# Patient Record
Sex: Male | Born: 1965 | Race: Black or African American | Hispanic: No | Marital: Married | Smoking: Current every day smoker
Health system: Southern US, Community
[De-identification: ages and names within clinical notes are randomized; demographics above are authoritative.]

## PROBLEM LIST (undated history)

## (undated) DIAGNOSIS — K219 Gastro-esophageal reflux disease without esophagitis: Secondary | ICD-10-CM

## (undated) DIAGNOSIS — I10 Essential (primary) hypertension: Secondary | ICD-10-CM

---

## 2004-05-08 ENCOUNTER — Emergency Department: Payer: Self-pay | Admitting: Emergency Medicine

## 2005-05-17 ENCOUNTER — Emergency Department: Payer: Self-pay | Admitting: Unknown Physician Specialty

## 2007-11-08 ENCOUNTER — Ambulatory Visit: Payer: Self-pay | Admitting: Urology

## 2008-04-13 ENCOUNTER — Emergency Department: Payer: Self-pay | Admitting: Internal Medicine

## 2010-01-12 ENCOUNTER — Ambulatory Visit: Payer: Self-pay | Admitting: Internal Medicine

## 2010-01-12 ENCOUNTER — Inpatient Hospital Stay: Payer: Self-pay | Admitting: Internal Medicine

## 2011-03-20 ENCOUNTER — Ambulatory Visit: Payer: Self-pay | Admitting: Urology

## 2011-07-24 ENCOUNTER — Emergency Department: Payer: Self-pay | Admitting: *Deleted

## 2011-07-24 LAB — CBC
HCT: 42.9 % (ref 40.0–52.0)
HGB: 14.5 g/dL (ref 13.0–18.0)
MCH: 32.4 pg (ref 26.0–34.0)
MCHC: 33.8 g/dL (ref 32.0–36.0)
MCV: 96 fL (ref 80–100)
Platelet: 236 10*3/uL (ref 150–440)
RBC: 4.48 10*6/uL (ref 4.40–5.90)
RDW: 13 % (ref 11.5–14.5)
WBC: 6.8 10*3/uL (ref 3.8–10.6)

## 2011-07-24 LAB — CK TOTAL AND CKMB (NOT AT ARMC)
CK, Total: 812 U/L — ABNORMAL HIGH (ref 35–232)
CK, Total: 739 U/L — ABNORMAL HIGH (ref 35–232)
CK-MB: 2.4 ng/mL (ref 0.5–3.6)
CK-MB: 2.1 ng/mL (ref 0.5–3.6)

## 2011-07-24 LAB — BASIC METABOLIC PANEL
Anion Gap: 8 (ref 7–16)
BUN: 14 mg/dL (ref 7–18)
Calcium, Total: 8.4 mg/dL — ABNORMAL LOW (ref 8.5–10.1)
Chloride: 104 mmol/L (ref 98–107)
Co2: 25 mmol/L (ref 21–32)
Creatinine: 1.09 mg/dL (ref 0.60–1.30)
EGFR (African American): >60
EGFR (Non-African Amer.): >60
Glucose: 103 mg/dL — ABNORMAL HIGH (ref 65–99)
Osmolality: 275 (ref 275–301)
Potassium: 3.6 mmol/L (ref 3.5–5.1)
Sodium: 137 mmol/L (ref 136–145)

## 2011-07-24 LAB — TROPONIN I
Troponin-I: 0.02 ng/mL
Troponin-I: <0.02 ng/mL

## 2011-07-27 ENCOUNTER — Observation Stay: Payer: Self-pay | Admitting: Internal Medicine

## 2011-07-27 LAB — CBC
HCT: 39.5 % — ABNORMAL LOW (ref 40.0–52.0)
HGB: 13.3 g/dL (ref 13.0–18.0)
MCH: 32.7 pg (ref 26.0–34.0)
MCHC: 33.7 g/dL (ref 32.0–36.0)
MCV: 97 fL (ref 80–100)
Platelet: 208 10*3/uL (ref 150–440)
RBC: 4.07 10*6/uL — ABNORMAL LOW (ref 4.40–5.90)
RDW: 12.7 % (ref 11.5–14.5)
WBC: 8.1 10*3/uL (ref 3.8–10.6)

## 2011-07-27 LAB — CK TOTAL AND CKMB (NOT AT ARMC)
CK, Total: 770 U/L — ABNORMAL HIGH (ref 35–232)
CK, Total: 760 U/L — ABNORMAL HIGH (ref 35–232)
CK, Total: 719 U/L — ABNORMAL HIGH (ref 35–232)
CK-MB: 2.7 ng/mL (ref 0.5–3.6)
CK-MB: 2.9 ng/mL (ref 0.5–3.6)
CK-MB: 2.6 ng/mL (ref 0.5–3.6)

## 2011-07-27 LAB — BASIC METABOLIC PANEL
Anion Gap: 11 (ref 7–16)
BUN: 16 mg/dL (ref 7–18)
Calcium, Total: 8.5 mg/dL (ref 8.5–10.1)
Chloride: 106 mmol/L (ref 98–107)
Co2: 25 mmol/L (ref 21–32)
Creatinine: 1.28 mg/dL (ref 0.60–1.30)
EGFR (African American): >60
EGFR (Non-African Amer.): >60
Glucose: 98 mg/dL (ref 65–99)
Osmolality: 284 (ref 275–301)
Potassium: 3.5 mmol/L (ref 3.5–5.1)
Sodium: 142 mmol/L (ref 136–145)

## 2011-07-27 LAB — TROPONIN I
Troponin-I: 0.03 ng/mL
Troponin-I: <0.02 ng/mL
Troponin-I: <0.02 ng/mL

## 2011-07-28 LAB — BASIC METABOLIC PANEL
Anion Gap: 9 (ref 7–16)
BUN: 13 mg/dL (ref 7–18)
Calcium, Total: 8.3 mg/dL — ABNORMAL LOW (ref 8.5–10.1)
Chloride: 105 mmol/L (ref 98–107)
Co2: 26 mmol/L (ref 21–32)
Creatinine: 1.02 mg/dL (ref 0.60–1.30)
EGFR (African American): >60
EGFR (Non-African Amer.): >60
Glucose: 92 mg/dL (ref 65–99)
Osmolality: 279 (ref 275–301)
Potassium: 3.9 mmol/L (ref 3.5–5.1)
Sodium: 140 mmol/L (ref 136–145)

## 2011-07-28 LAB — CBC WITH DIFFERENTIAL/PLATELET
Basophil #: 0 10*3/uL (ref 0.0–0.1)
Basophil %: 0.4 %
Eosinophil #: 0.1 10*3/uL (ref 0.0–0.7)
Eosinophil %: 2.1 %
HCT: 39.2 % — ABNORMAL LOW (ref 40.0–52.0)
HGB: 12.9 g/dL — ABNORMAL LOW (ref 13.0–18.0)
Lymphocyte #: 1.9 10*3/uL (ref 1.0–3.6)
Lymphocyte %: 28 %
MCH: 31.9 pg (ref 26.0–34.0)
MCHC: 32.9 g/dL (ref 32.0–36.0)
MCV: 97 fL (ref 80–100)
Monocyte #: 0.6 x10 3/mm (ref 0.2–1.0)
Monocyte %: 9.6 %
Neutrophil #: 4 10*3/uL (ref 1.4–6.5)
Neutrophil %: 59.9 %
Platelet: 195 10*3/uL (ref 150–440)
RBC: 4.05 10*6/uL — ABNORMAL LOW (ref 4.40–5.90)
RDW: 12.8 % (ref 11.5–14.5)
WBC: 6.7 10*3/uL (ref 3.8–10.6)

## 2011-07-28 LAB — LIPID PANEL
Cholesterol: 180 mg/dL (ref 0–200)
HDL Cholesterol: 44 mg/dL (ref 40–60)
Ldl Cholesterol, Calc: 99 mg/dL (ref 0–100)
Triglycerides: 185 mg/dL (ref 0–200)
VLDL Cholesterol, Calc: 37 mg/dL (ref 5–40)

## 2011-07-29 LAB — BASIC METABOLIC PANEL
Anion Gap: 7 (ref 7–16)
BUN: 12 mg/dL (ref 7–18)
Calcium, Total: 8.4 mg/dL — ABNORMAL LOW (ref 8.5–10.1)
Chloride: 105 mmol/L (ref 98–107)
Co2: 29 mmol/L (ref 21–32)
Creatinine: 1 mg/dL (ref 0.60–1.30)
EGFR (African American): >60
EGFR (Non-African Amer.): >60
Glucose: 92 mg/dL (ref 65–99)
Osmolality: 281 (ref 275–301)
Potassium: 3.7 mmol/L (ref 3.5–5.1)
Sodium: 141 mmol/L (ref 136–145)

## 2011-07-29 LAB — CBC WITH DIFFERENTIAL/PLATELET
Basophil #: 0 10*3/uL (ref 0.0–0.1)
Basophil %: 0.7 %
Eosinophil #: 0.1 10*3/uL (ref 0.0–0.7)
Eosinophil %: 2.2 %
HCT: 40 % (ref 40.0–52.0)
HGB: 13.6 g/dL (ref 13.0–18.0)
Lymphocyte #: 2.3 10*3/uL (ref 1.0–3.6)
Lymphocyte %: 36.1 %
MCH: 33 pg (ref 26.0–34.0)
MCHC: 34 g/dL (ref 32.0–36.0)
MCV: 97 fL (ref 80–100)
Monocyte #: 0.7 x10 3/mm (ref 0.2–1.0)
Monocyte %: 10.1 %
Neutrophil #: 3.3 10*3/uL (ref 1.4–6.5)
Neutrophil %: 50.9 %
Platelet: 195 10*3/uL (ref 150–440)
RBC: 4.12 10*6/uL — ABNORMAL LOW (ref 4.40–5.90)
RDW: 12.9 % (ref 11.5–14.5)
WBC: 6.4 10*3/uL (ref 3.8–10.6)

## 2011-07-29 LAB — PROTIME-INR
INR: 1
Prothrombin Time: 13.7 secs (ref 11.5–14.7)

## 2012-01-14 ENCOUNTER — Ambulatory Visit: Payer: Self-pay | Admitting: Family Medicine

## 2012-06-01 ENCOUNTER — Emergency Department: Payer: Self-pay | Admitting: Emergency Medicine

## 2012-06-06 ENCOUNTER — Emergency Department: Payer: Self-pay | Admitting: Emergency Medicine

## 2012-07-02 ENCOUNTER — Emergency Department: Payer: Self-pay | Admitting: Emergency Medicine

## 2014-06-11 NOTE — Discharge Summary (Signed)
PATIENT NAME:  Calvin FickleRUFFIN, Garmon S MR#:  [id number] DATE OF BIRTH:  08/23/1965  DATE OF ADMISSION:  07/27/2011 DATE OF DISCHARGE:  07/29/2011  ADMITTING DIAGNOSIS: Chest pain.   DISCHARGE DIAGNOSES:  1. Chest pain noncardiac in origin, possibly could be related to accelerated hypertension, could be gastroesophageal reflux disease, could be musculoskeletal in nature. Negative cardiac catheterization.  2. Accelerated hypertension, currently improved with higher dose of Norvasc.  3. Elevated CPK of unclear cause. Patient not on any cholesterol lowering medication to explain that. He denies any recent trauma. Will need to have the CPK repeated as an outpatient. If continues to be elevated needs further evaluation for things such as myositis etc.  4. History of gastroesophageal reflux disease. Patient was not taking his Nexium in light of his chest symptoms. Patient will be restarted on Nexium.   CONSULTANT: Dr. Juliann Paresallwood.  LABORATORY, DIAGNOSTIC AND RADIOLOGICAL DATA: Admitting glucose 103, BUN 14, creatinine 1.09, sodium 137, potassium 3.6, chloride 106, CO2 25, calcium 8.4. Troponin 0.02, 0.02 and 0.03. CPK 812, 739, 770. CK-MB 2.4, 2.1 and 2.7. WBC 6.8, hemoglobin 14.4, platelet count 236. His EKG showed normal sinus rhythm without any ST-T wave changes. Cardiac catheterization showed normal coronaries with normal ejection fraction.   HOSPITAL COURSE: Please refer to history and physical done on admission. Patient is a 49 year old African American male with history of hypertension, solitary kidney, hyperlipidemia, not on any treatment for his hyperlipidemia presented with left-sided chest pain on and off going on for more than a week. Patient has had a stress test about a year ago which was negative. He came to the ED. In the ER he was noted to have elevated total CPK without elevated troponin or CK-MB. Due to his persistent symptoms, multiple risk factors I was asked to admit the patient. The ED  physician had already contacted cardiologist prior to me seeing the patient. The plan was for him to get catheterization. Patient underwent cardiac catheterization today. His cardiac catheterization is normal without any evidence of obstruction. His chest pain is likely due to either reflux, could be musculoskeletal. It is noncardiac in nature. Patient also was noted to have elevated CPK of unclear etiology. His CPK will be repeated as an outpatient. At this time he is stable for discharge. Also add a fasting lipid panel to his blood work that was checked. His LDL 99, cholesterol 180, triglycerides 185, HDL 44.  DISCHARGE MEDICATIONS:  1. Nexium 40, 1 tab p.o. daily.  2. Metoprolol 50, 1 tab p.o. b.i.d.  3. Norvasc 10 daily.  4. Aspirin 81,1 tab p.o. daily.  5. Nexium 40 daily.   DIET: Low sodium.   ACTIVITY: As tolerated.   TIMEFRAME FOR FOLLOW UP: 1 to 2 weeks with Dr. Elizabeth Sauereanna Jones. Patient to have a CPK checked at the time of visit to Dr. Elizabeth Sauereanna Jones.   TIME SPENT: 22 minutes.   ____________________________ Lacie ScottsShreyang H. Allena KatzPatel, MD shp:cms D: 07/29/2011 10:56:47 ET T: 07/29/2011 13:53:46 ET JOB#: [id number]  cc: Shemeca Lukasik H. Allena KatzPatel, MD, <Dictator> Duanne Limerickeanna C. Jones, MD Charise CarwinSHREYANG H Anni Hocevar MD ELECTRONICALLY SIGNED 08/06/2011 11:40

## 2014-06-11 NOTE — H&P (Signed)
PATIENT NAME:  Renae FickleRUFFIN, Ehtan S MR#:  [id number] DATE OF BIRTH:  19-Nov-1965  DATE OF ADMISSION:  07/27/2011  PRIMARY CARE PHYSICIAN: Elizabeth Sauereanna Jones, MD  ED REFERRING PHYSICIAN: Joseph ArtAlice Finnell, MD  CHIEF COMPLAINT: Left-sided chest pain.   HISTORY OF PRESENT ILLNESS: The patient is a 49 year old African American male with history of hypertension, solitary kidney, and hyperlipidemia for which he is not on any treatment who presents with left-sided chest pain which he reports has been going on since 07/15/2011, since his passed away. The patient reports that he has been having these sharp, intermittent left-sided chest pains usually lasting no more than five minutes, a few times a day, unrelated to activity, occurs multiple times daily. He was seen in the ED a few days ago, on 07/24/2011, and was discharged with followup with his primary care provider. The patient was seen by his primary MD and was started on a blood pressure medication. He reports that since last night his chest pain was worse, therefore, he came to the ED. The ED MD, before I saw the patient, spoke to Dr. Juliann Paresallwood, the cardiologist on call, and the plan is for him to get a cardiac catheterization due to anginal symptoms. The patient otherwise also complains of some mild dyspnea on exertion. He denies any numbness or tingling, denies any lower extremity swelling, no calf pain, no cough, no wheezing, no abdominal pain, no nausea, vomiting, or diarrhea, and no urinary symptoms.   PAST MEDICAL HISTORY:  1. Gastroesophageal reflux disease.  2. 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  3. Hypertension.  4. History of knee surgery.   ALLERGIES: No known drug allergies.   CURRENT MEDICATIONS:  1. Nexium 40 mg daily, which he was on but he stopped taking that. 2. Metoprolol tartrate 50 mg 1 tab p.o. twice a day. 3. Amlodipine 5 mg daily.   SOCIAL HISTORY: He continues to smoke about one pack per day for about 20 years. Drinks on a daily  basis. He reports that he drinks some beer and liquor. He denies any drug use.   FAMILY HISTORY: Father with coronary artery disease in his 4060s.   REVIEW OF SYSTEMS: CONSTITUTIONAL: Denies any fevers or chills. No weight loss. No weight gain. Complains of generalized weakness. HEENT: Denies any double vision. No erythema. No glaucoma. No cataracts. Denies any nasal drainage. No allergies. No epistaxis. Oropharynx - denies any difficulty with swallowing. CARDIOVASCULAR: Chest symptoms as above. Has history of hypertension. No syncope. No arrhythmias. PULMONARY: Denies any chronic obstructive pulmonary disease. No tuberculosis. No pneumonia. No wheezing. No hemoptysis. GASTROINTESTINAL: No nausea, vomiting, or diarrhea. No hematemesis. No hematochezia. GU: Denies any dysuria, hematuria, renal calculus, frequency, burning or hesitancy. ENDOCRINE: Denies any pathology or nocturia. No thyroid problems. SKIN: Denies any rash. MUSCULOSKELETAL: Denies any pain in the muscles or joints. HEMATOLOGIC: Denies easy bruisability or bleeding. NEUROLOGIC: Denies any numbness. No cerebrovascular accident. No seizures. PSYCHIATRIC: Denies any anxiety, insomnia, or ADD.   PHYSICAL EXAMINATION:   VITAL SIGNS: Temperature 97.9, pulse 52, respirations 18, and blood pressure 165/93.   GENERAL: The patient appears comfortable, in no acute distress.   HEENT: Pupils are equal, round, and reactive to light and accommodation. Extraocular movements intact. There is no conjunctival pallor.  No scleral icterus. Oropharynx clear without any exudates. External ear exam shows no drainage or ulceration.   NECK: No thyromegaly. No carotid bruits.   HEART: Regular rate and rhythm. No murmurs, rubs, clicks, or gallops. PMI is not displaced.  LUNGS: Clear to auscultation bilaterally without any rales, rhonchi, or wheezing.   ABDOMEN: Soft, nontender, and nondistended. Positive bowel sounds x4. No hepatosplenomegaly.   EXTREMITIES:  No clubbing, cyanosis, or edema.   SKIN: No rash.   MUSCULOSKELETAL: No erythema or swelling. No reproducible chest pain.   PSYCHIATRIC: Not anxious or depressed.   NEUROLOGICAL: Awake, alert, and oriented x3. No focal deficits.   VASCULAR: Good DP and PT pulses.   LYMPH NODES: No lymph nodes palpable.   LABS/STUDIES: Glucose is 98, BUN 16, creatinine 1.28, sodium 142, potassium 3.5, chloride 106, CO2 25, calcium 8.5. CPK 770. CK-MB 2.7. Troponin 0.02. WBC 8.1, hemoglobin 13.3, platelet count 280.  EKG: Normal sinus rhythm without any ST-T wave changes.   ASSESSMENT AND PLAN: The patient is a 49 year old African American male with history of hypertension, hyperlipidemia gastroesophageal reflux disease, and chest pain ongoing for the past two weeks who presents with worsening symptoms.  1. Chest pain, possibly due to angina. Total CPK is elevated without elevated CK-MB. Troponin is currently negative. He does have multiple risk factors including hypertension, hyperlipidemia, also nicotine addiction. The ED physician has spoken to Dr. Juliann Pares who is planning to do cardiac catheterization in the morning. At this time, we will place him on aspirin. Continue metoprolol as taking at home. We will check a fasting lipid panel in the a.m. Nitroglycerin p.r.n.  2. Hypertension. I will increase his Norvasc. He was started on metoprolol which I will continue. His heart rate is low because of that.  3. Gastroesophageal reflux disease. We will place him on PPIs. The patient was on Nexium, which he has not been taking his Nexium.         4. Hyperlipidemia, not on any treatment. We will check a fasting lipid panel in the a.m.   TIME SPENT: 35 minutes spent.  ____________________________ Lacie Scotts. Allena Katz, MD shp:slb D: 07/27/2011 10:38:18 ET T: 07/27/2011 12:17:13 ET JOB#: [id number]  cc: Eyan Hagood H. Allena Katz, MD, <Dictator> Duanne Limerick, MD Charise Carwin MD ELECTRONICALLY SIGNED 07/28/2011  8:05

## 2014-06-11 NOTE — Consult Note (Signed)
PATIENT NAME:  Calvin Lynch, Denarius S MR#:  [id number] DATE OF BIRTH:  March 28, 1965  DATE OF CONSULTATION:  07/27/2011  REFERRING PHYSICIAN:   CONSULTING PHYSICIAN:  Saadiq Poche D. Blondell Laperle, MD  INDICATIONS: Chest pain, angina.  HISTORY OF PRESENT ILLNESS:  Mr. Calvin Lynch is a 49 year old African American male usually seen by Dr. Elizabeth Sauereanna Jones who presented to the Emergency Room with left-sided chest pain. He had work-up in the past for chest pain and angina including a functional study about a year or so ago. He has hypertension, solitary kidney, and hyperlipidemia. He presented with left-sided chest pain, which got progressively worse. It started about two weeks ago. The patient reports he has been having sharp, intermittent left-sided chest pain, lasting about five minutes at a time, unrelated to activity, multiple episodes during the day. He came to the Emergency Room maybe three days ago and was discharged with followup his primary care doctor. The patient was seen by his primary care doctor who started him on blood pressure medication. He reports that overnight he had recurrent pain and over the last day or two it has gotten progressively worse, so he came back to the Emergency Room and was subsequently advised to be admitted for further evaluation and care.   REVIEW OF SYSTEMS: No blackout spells or syncope. No nausea or vomiting. No fever. No chills. No sweats. No weight loss or weight gain. No hemoptysis or hematemesis. No bright red blood per rectum.   PAST MEDICAL HISTORY:  1. Reflux.  2. Solitary kidney.  3. Hypertension.  4. Degenerative joint disease.   ALLERGIES: None.   MEDICATIONS:  1. Nexium 40 mg a day.  2. Metoprolol 50 mg twice a day.  3. Amlodipine 5 mg a day.   FAMILY HISTORY: Coronary artery disease.   SOCIAL HISTORY: Smokes 1 pack a day. Drinks regularly.   PHYSICAL EXAMINATION:  VITAL SIGNS: Blood pressure 165/93, pulse 55, respiratory rate 16, afebrile.   HEENT:  Normocephalic, atraumatic. Pupils equal and reactive to light.   NECK: Supple. No jugular venous distention or lymphadenopathy.   LUNGS: Clear to auscultation and percussion. No significant wheeze, rhonchi, or rale.   HEART: Regular rate and rhythm. Positive S4. Systolic ejection murmur left sternal border. PMI is nondisplaced.   ABDOMEN: Benign.   EXTREMITIES: Within normal limits.   NEUROLOGIC: Intact.   SKIN: Normal.   LABORATORY DATA: BUN 16, creatinine 1.28, was elevated. Glucose 98, sodium 142, potassium 3.5, chloride 106, CO2 25. CK 770, MB 2.7, troponin 0.02. White count 8, hemoglobin 13, and platelet count 280.   EKG: Normal sinus rhythm, nonspecific ST-T wave changes with left ventricular hypertrophy.   ASSESSMENT: Chest pain, angina, poorly controlled hypertension, reflux, obesity, alcohol abuse.   PLAN: Recommend admit. Rule out for myocardial infarction. Consider further work-up. Will probably need cardiac catheterization  to definitively rule out significant coronary disease and angina. We will probably evaluate his solitary renal artery for renal artery stenosis with his poorly controlled hypertension. Would recommend weight loss and exercise. Recommend lipid management as necessary.  Advised the patient to refrain from alcohol abuse, weight loss and exercise, and treatment for reflux. Treat the patient accordingly.     ____________________________ Bobbie Stackwayne D. Juliann Paresallwood, MD ddc:bjt D: 08/03/2011 22:27:11 ET T: 08/04/2011 08:57:59 ET JOB#: [id number]  cc: Sibel Khurana D. Juliann Paresallwood, MD, <Dictator> Alwyn PeaWAYNE D Seryna Marek MD ELECTRONICALLY SIGNED 08/04/2011 9:24

## 2014-10-02 ENCOUNTER — Encounter: Payer: Self-pay | Admitting: Emergency Medicine

## 2014-10-02 ENCOUNTER — Ambulatory Visit
Admission: EM | Admit: 2014-10-02 | Discharge: 2014-10-02 | Disposition: A | Discharge status: Home / Self Care | Payer: PRIVATE HEALTH INSURANCE | Attending: Family Medicine | Admitting: Family Medicine

## 2014-10-02 DIAGNOSIS — I10 Essential (primary) hypertension: Secondary | ICD-10-CM | POA: Insufficient documentation

## 2014-10-02 DIAGNOSIS — F17210 Nicotine dependence, cigarettes, uncomplicated: Secondary | ICD-10-CM | POA: Diagnosis not present

## 2014-10-02 DIAGNOSIS — R1032 Left lower quadrant pain: Secondary | ICD-10-CM | POA: Insufficient documentation

## 2014-10-02 DIAGNOSIS — K219 Gastro-esophageal reflux disease without esophagitis: Secondary | ICD-10-CM | POA: Diagnosis not present

## 2014-10-02 DIAGNOSIS — R109 Unspecified abdominal pain: Secondary | ICD-10-CM | POA: Diagnosis present

## 2014-10-02 HISTORY — DX: Essential (primary) hypertension: I10

## 2014-10-02 HISTORY — DX: Gastro-esophageal reflux disease without esophagitis: K21.9

## 2014-10-02 LAB — CBC WITH DIFFERENTIAL/PLATELET
Basophils Absolute: 0.1 10*3/uL (ref 0–0.1)
Basophils Relative: 1 %
Eosinophils Absolute: 0.1 10*3/uL (ref 0–0.7)
Eosinophils Relative: 1 %
HCT: 49.2 % (ref 40.0–52.0)
Hemoglobin: 16.5 g/dL (ref 13.0–18.0)
Lymphocytes Relative: 20 %
Lymphs Abs: 2 10*3/uL (ref 1.0–3.6)
MCH: 32.2 pg (ref 26.0–34.0)
MCHC: 33.6 g/dL (ref 32.0–36.0)
MCV: 95.8 fL (ref 80.0–100.0)
Monocytes Absolute: 1.3 10*3/uL — ABNORMAL HIGH (ref 0.2–1.0)
Monocytes Relative: 13 %
Neutro Abs: 6.6 10*3/uL — ABNORMAL HIGH (ref 1.4–6.5)
Neutrophils Relative %: 65 %
Platelets: 214 10*3/uL (ref 150–440)
RBC: 5.13 MIL/uL (ref 4.40–5.90)
RDW: 13.4 % (ref 11.5–14.5)
WBC: 10.1 10*3/uL (ref 3.8–10.6)

## 2014-10-02 LAB — COMPREHENSIVE METABOLIC PANEL
ALT: 21 U/L (ref 17–63)
AST: 22 U/L (ref 15–41)
Albumin: 4.5 g/dL (ref 3.5–5.0)
Alkaline Phosphatase: 58 U/L (ref 38–126)
Anion gap: 8 (ref 5–15)
BUN: 16 mg/dL (ref 6–20)
CO2: 30 mmol/L (ref 22–32)
Calcium: 9.2 mg/dL (ref 8.9–10.3)
Chloride: 99 mmol/L — ABNORMAL LOW (ref 101–111)
Creatinine, Ser: 1.33 mg/dL — ABNORMAL HIGH (ref 0.61–1.24)
GFR calc Af Amer: >60 mL/min (ref >60)
GFR calc non Af Amer: >60 mL/min (ref >60)
Glucose, Bld: 101 mg/dL — ABNORMAL HIGH (ref 65–99)
Potassium: 4.1 mmol/L (ref 3.5–5.1)
Sodium: 137 mmol/L (ref 135–145)
Total Bilirubin: 1 mg/dL (ref 0.3–1.2)
Total Protein: 7.9 g/dL (ref 6.5–8.1)

## 2014-10-02 LAB — URINALYSIS COMPLETE WITH MICROSCOPIC (ARMC ONLY)
Bacteria, UA: NONE SEEN — AB
Bilirubin Urine: NEGATIVE
Glucose, UA: NEGATIVE mg/dL
Ketones, ur: NEGATIVE mg/dL
Leukocytes, UA: NEGATIVE
Nitrite: NEGATIVE
Protein, ur: 30 mg/dL — AB
Specific Gravity, Urine: 1.015 (ref 1.005–1.030)
Squamous Epithelial / LPF: NONE SEEN — AB
pH: 6 (ref 5.0–8.0)

## 2014-10-02 MED ORDER — HYDROCODONE-ACETAMINOPHEN 5-325 MG PO TABS
1.0000 | ORAL_TABLET | Freq: Four times a day (QID) | ORAL | Status: DC | PRN
Start: 2014-10-02 — End: 2014-10-13

## 2014-10-02 MED ORDER — CIPROFLOXACIN HCL 500 MG PO TABS: 500.0000 mg | ORAL_TABLET | Freq: Two times a day (BID) | ORAL | Status: DC

## 2014-10-02 MED ORDER — METRONIDAZOLE 500 MG PO TABS: 500.0000 mg | ORAL_TABLET | Freq: Three times a day (TID) | ORAL | Status: DC

## 2014-10-02 NOTE — ED Provider Notes (Signed)
CSN: [id number]     Arrival date & time 10/02/14  1503 History   First MD Initiated Contact with Patient 10/02/14 1536     Chief Complaint  Patient presents with  . Abdominal Pain   (Consider location/radiation/quality/duration/timing/severity/associated sxs/prior Treatment) HPI Comments: 49 yo male with a 2 days h/o left lower abdominal pain, constant but intensity waxes and wanes; associated with nausea. Denies any injuries, fevers, chills, vomiting, diarrhea. Has h/o GERD and HTN,  otherwise denies other chronic health problems.   The history is provided by the patient.    Past Medical History  Diagnosis Date  . Hypertension   . GERD (gastroesophageal reflux disease)    History reviewed. No pertinent past surgical history. History reviewed. No pertinent family history. Social History  Substance Use Topics  . Smoking status: Current Every Day Smoker -- 1.00 packs/day    Types: Cigarettes  . Smokeless tobacco: None  . Alcohol Use: Yes     Comment: pt states a 6 pack a day    Review of Systems  Allergies  Review of patient's allergies indicates no known allergies.  Home Medications   Prior to Admission medications   Medication Sig Start Date End Date Taking? Authorizing Provider  esomeprazole (NEXIUM) 40 MG capsule Take 40 mg by mouth daily at 12 noon.   Yes Historical Provider, MD  ciprofloxacin (CIPRO) 500 MG tablet Take 1 tablet (500 mg total) by mouth every 12 (twelve) hours. 10/02/14   Payton Mccallum, MD  HYDROcodone-acetaminophen (NORCO/VICODIN) 5-325 MG per tablet Take 1-2 tablets by mouth every 6 (six) hours as needed. 10/02/14   Payton Mccallum, MD  metroNIDAZOLE (FLAGYL) 500 MG tablet Take 1 tablet (500 mg total) by mouth 3 (three) times daily. 10/02/14   Payton Mccallum, MD   BP 168/104 mmHg  Pulse 72  Temp(Src) 98.2 F (36.8 C) (Oral)  Resp 16  SpO2 98% Physical Exam  Constitutional: He appears well-developed and well-nourished. No distress.  HENT:  Head:  Atraumatic.  Neck: Normal range of motion. Neck supple.  Cardiovascular: Normal rate, normal heart sounds and intact distal pulses.   Pulmonary/Chest: Effort normal and breath sounds normal. No respiratory distress. He has no wheezes. He has no rales.  Abdominal: Soft. Bowel sounds are normal. He exhibits no distension and no mass. There is tenderness (mild left lower quadrant abdominal tenderness to palpation;  no rebound or guarding). There is no rebound and no guarding.  Lymphadenopathy:    He has no cervical adenopathy.  Skin: He is not diaphoretic.  Vitals reviewed.   ED Course  Procedures (including critical care time) Labs Review Labs Reviewed  CBC WITH DIFFERENTIAL/PLATELET - Abnormal; Notable for the following:    Neutro Abs 6.6 (*)    Monocytes Absolute 1.3 (*)    All other components within normal limits  COMPREHENSIVE METABOLIC PANEL - Abnormal; Notable for the following:    Chloride 99 (*)    Glucose, Bld 101 (*)    Creatinine, Ser 1.33 (*)    All other components within normal limits  URINALYSIS COMPLETEWITH MICROSCOPIC ([facility] ONLY) - Abnormal; Notable for the following:    Hgb urine dipstick 1+ (*)    Protein, ur 30 (*)    Bacteria, UA NONE SEEN (*)    Squamous Epithelial / LPF NONE SEEN (*)    All other components within normal limits    Imaging Review No results found.   MDM   1. Abdominal pain, left lower quadrant    Discharge  Medication List as of 10/02/2014  5:21 PM    START taking these medications   Details  ciprofloxacin (CIPRO) 500 MG tablet Take 1 tablet (500 mg total) by mouth every 12 (twelve) hours., Starting 10/02/2014, Until Discontinued, Normal    HYDROcodone-acetaminophen (NORCO/VICODIN) 5-325 MG per tablet Take 1-2 tablets by mouth every 6 (six) hours as needed., Starting 10/02/2014, Until Discontinued, Print    metroNIDAZOLE (FLAGYL) 500 MG tablet Take 1 tablet (500 mg total) by mouth 3 (three) times daily., Starting 10/02/2014, Until  Discontinued, Normal      Plan: 1. Test/x-ray results and diagnosis reviewed with patient 2. rx as per orders; risks, benefits, potential side effects reviewed with patient 3. Recommend supportive treatment with increased fluids, rest 4. F/u prn if symptoms worsen or don't improve    Payton Mccallum, MD 10/09/14 1536

## 2014-10-02 NOTE — ED Notes (Signed)
Pt states that yesterday morning he woke with lover mid abdominal pain that woke him up out of his sleep

## 2014-10-13 ENCOUNTER — Encounter: Payer: Self-pay | Admitting: Family Medicine

## 2014-10-13 ENCOUNTER — Emergency Department
Admission: EM | Admit: 2014-10-13 | Discharge: 2014-10-13 | Disposition: A | Discharge status: Home / Self Care | Payer: PRIVATE HEALTH INSURANCE | Attending: Emergency Medicine | Admitting: Emergency Medicine

## 2014-10-13 ENCOUNTER — Ambulatory Visit (INDEPENDENT_AMBULATORY_CARE_PROVIDER_SITE_OTHER): Payer: PRIVATE HEALTH INSURANCE | Admitting: Family Medicine

## 2014-10-13 ENCOUNTER — Emergency Department: Payer: PRIVATE HEALTH INSURANCE

## 2014-10-13 VITALS — BP 130/82 | HR 76 | Ht 72.0 in | Wt 230.0 lb

## 2014-10-13 DIAGNOSIS — K219 Gastro-esophageal reflux disease without esophagitis: Secondary | ICD-10-CM | POA: Diagnosis not present

## 2014-10-13 DIAGNOSIS — K5733 Diverticulitis of large intestine without perforation or abscess with bleeding: Secondary | ICD-10-CM

## 2014-10-13 DIAGNOSIS — Z09 Encounter for follow-up examination after completed treatment for conditions other than malignant neoplasm: Secondary | ICD-10-CM | POA: Diagnosis not present

## 2014-10-13 DIAGNOSIS — Z720 Tobacco use: Secondary | ICD-10-CM | POA: Diagnosis not present

## 2014-10-13 DIAGNOSIS — I10 Essential (primary) hypertension: Secondary | ICD-10-CM | POA: Insufficient documentation

## 2014-10-13 DIAGNOSIS — R1032 Left lower quadrant pain: Secondary | ICD-10-CM | POA: Diagnosis present

## 2014-10-13 DIAGNOSIS — Z79899 Other long term (current) drug therapy: Secondary | ICD-10-CM | POA: Diagnosis not present

## 2014-10-13 DIAGNOSIS — K5792 Diverticulitis of intestine, part unspecified, without perforation or abscess without bleeding: Secondary | ICD-10-CM | POA: Insufficient documentation

## 2014-10-13 DIAGNOSIS — Z7982 Long term (current) use of aspirin: Secondary | ICD-10-CM | POA: Diagnosis not present

## 2014-10-13 LAB — CBC
HCT: 45.2 % (ref 40.0–52.0)
Hemoglobin: 15 g/dL (ref 13.0–18.0)
MCH: 31.6 pg (ref 26.0–34.0)
MCHC: 33.2 g/dL (ref 32.0–36.0)
MCV: 95.1 fL (ref 80.0–100.0)
Platelets: 280 10*3/uL (ref 150–440)
RBC: 4.75 MIL/uL (ref 4.40–5.90)
RDW: 13 % (ref 11.5–14.5)
WBC: 6.9 10*3/uL (ref 3.8–10.6)

## 2014-10-13 LAB — COMPREHENSIVE METABOLIC PANEL
ALT: 35 U/L (ref 17–63)
AST: 29 U/L (ref 15–41)
Albumin: 4.4 g/dL (ref 3.5–5.0)
Alkaline Phosphatase: 53 U/L (ref 38–126)
Anion gap: 8 (ref 5–15)
BUN: 13 mg/dL (ref 6–20)
CO2: 28 mmol/L (ref 22–32)
Calcium: 9 mg/dL (ref 8.9–10.3)
Chloride: 102 mmol/L (ref 101–111)
Creatinine, Ser: 1.21 mg/dL (ref 0.61–1.24)
GFR calc Af Amer: >60 mL/min (ref >60)
GFR calc non Af Amer: >60 mL/min (ref >60)
Glucose, Bld: 96 mg/dL (ref 65–99)
Potassium: 4.2 mmol/L (ref 3.5–5.1)
Sodium: 138 mmol/L (ref 135–145)
Total Bilirubin: 0.9 mg/dL (ref 0.3–1.2)
Total Protein: 7.8 g/dL (ref 6.5–8.1)

## 2014-10-13 LAB — URINALYSIS COMPLETE WITH MICROSCOPIC (ARMC ONLY)
Bacteria, UA: NONE SEEN
Bilirubin Urine: NEGATIVE
Glucose, UA: NEGATIVE mg/dL
Ketones, ur: NEGATIVE mg/dL
Leukocytes, UA: NEGATIVE
Nitrite: NEGATIVE
Protein, ur: 30 mg/dL — AB
Specific Gravity, Urine: 1.017 (ref 1.005–1.030)
Squamous Epithelial / LPF: NONE SEEN
pH: 6 (ref 5.0–8.0)

## 2014-10-13 LAB — HEMOCCULT GUIAC POC 1CARD (OFFICE): Fecal Occult Blood, POC: POSITIVE — AB

## 2014-10-13 LAB — LIPASE, BLOOD: Lipase: 24 U/L (ref 22–51)

## 2014-10-13 MED ORDER — METRONIDAZOLE 500 MG PO TABS: 500.0000 mg | ORAL_TABLET | Freq: Three times a day (TID) | ORAL | Status: AC

## 2014-10-13 MED ORDER — IOHEXOL 240 MG/ML SOLN
25.0000 mL | Freq: Once | INTRAMUSCULAR | Status: AC | PRN
  Administered 2014-10-13: 25 mL via INTRAVENOUS

## 2014-10-13 MED ORDER — ESOMEPRAZOLE MAGNESIUM 40 MG PO CPDR: 40.0000 mg | DELAYED_RELEASE_CAPSULE | Freq: Every day | ORAL | Status: DC

## 2014-10-13 MED ORDER — AMLODIPINE BESYLATE 10 MG PO TABS: 10.0000 mg | ORAL_TABLET | Freq: Every day | ORAL | Status: DC

## 2014-10-13 MED ORDER — METRONIDAZOLE 500 MG PO TABS: 500.0000 mg | ORAL_TABLET | Freq: Three times a day (TID) | ORAL | Status: DC

## 2014-10-13 MED ORDER — METOPROLOL SUCCINATE ER 50 MG PO TB24: 50.0000 mg | ORAL_TABLET | Freq: Every day | ORAL | Status: DC

## 2014-10-13 MED ORDER — CIPROFLOXACIN HCL 500 MG PO TABS: 500.0000 mg | ORAL_TABLET | Freq: Two times a day (BID) | ORAL | Status: DC

## 2014-10-13 MED ORDER — CIPROFLOXACIN HCL 500 MG PO TABS: 500.0000 mg | ORAL_TABLET | Freq: Two times a day (BID) | ORAL | Status: AC

## 2014-10-13 MED ORDER — DOCUSATE SODIUM 100 MG PO CAPS: 100.0000 mg | ORAL_CAPSULE | Freq: Two times a day (BID) | ORAL | Status: AC

## 2014-10-13 MED ORDER — HYDROCODONE-ACETAMINOPHEN 5-325 MG PO TABS: 1.0000 | ORAL_TABLET | ORAL | Status: DC | PRN

## 2014-10-13 MED ORDER — IOHEXOL 300 MG/ML  SOLN
100.0000 mL | Freq: Once | INTRAMUSCULAR | Status: AC | PRN
  Administered 2014-10-13: 100 mL via INTRAVENOUS

## 2014-10-13 NOTE — Addendum Note (Signed)
Addended by: Elizabeth Sauer C on: 10/13/2014 12:00 PM   Modules accepted: Orders, SmartSet

## 2014-10-13 NOTE — ED Notes (Signed)
Pt c/o LLQ abd pain for the past 10 days..states was seen by PCP today and concerned for diverticulitis.the patient denies N/V/D.Marland Kitchen

## 2014-10-13 NOTE — Progress Notes (Addendum)
Name: Calvin Lynch   MRN: [id number]    DOB: [birth year]   Date:10/13/2014       Progress Note  Subjective  Chief Complaint  Chief Complaint  Patient presents with  . Follow-up    UC- treated for Divert.- finished meds on Mon  . Hypertension  . Gastrophageal Reflux    Hypertension This is a chronic problem. The current episode started more than 1 year ago. The problem has been gradually improving since onset. Pertinent negatives include no anxiety, blurred vision, chest pain, headaches, malaise/fatigue, neck pain, orthopnea, palpitations, peripheral edema, PND, shortness of breath or sweats. There are no associated agents to hypertension. There are no known risk factors for coronary artery disease. Past treatments include calcium channel blockers and beta blockers. The current treatment provides moderate improvement. There are no compliance problems.  There is no history of angina, kidney disease, CAD/MI, CVA, heart failure, left ventricular hypertrophy, PVD, renovascular disease or retinopathy. There is no history of chronic renal disease.  Gastrophageal Reflux He reports no abdominal pain, no belching, no chest pain, no choking, no coughing, no dysphagia, no heartburn, no hoarse voice, no nausea, no sore throat or no wheezing. This is a chronic problem. The current episode started more than 1 year ago. The problem occurs occasionally. The problem has been waxing and waning. Nothing aggravates the symptoms. Pertinent negatives include no fatigue, melena, muscle weakness or weight loss. There are no known risk factors. He has tried a PPI for the symptoms. The treatment provided moderate relief.  Abdominal Pain This is a new problem. The current episode started in the past 7 days. The onset quality is gradual. The problem occurs intermittently. The problem has been gradually improving. The abdominal pain radiates to the LLQ. Pertinent negatives include no anorexia, arthralgias, belching,  constipation, diarrhea, dysuria, fever, flatus, frequency, headaches, hematuria, melena, myalgias, nausea or weight loss. Nothing aggravates the pain. Relieved by: antibiotic treatment. He has tried proton pump inhibitors and antibiotics for the symptoms. The treatment provided mild relief. His past medical history is significant for GERD.    No problem-specific assessment & plan notes found for this encounter.   Past Medical History  Diagnosis Date  . Hypertension   . GERD (gastroesophageal reflux disease)     History reviewed. No pertinent past surgical history.  Family History  Problem Relation Age of Onset  . Diabetes Mother   . Hypertension Mother     Social History   Social History  . Marital Status: Married    Spouse Name: N/A  . Number of Children: N/A  . Years of Education: N/A   Occupational History  . Not on file.   Social History Main Topics  . Smoking status: Current Every Day Smoker -- 1.00 packs/day    Types: Cigarettes  . Smokeless tobacco: Not on file  . Alcohol Use: 0.0 oz/week    0 Standard drinks or equivalent per week     Comment: pt states a 6 pack a day  . Drug Use: No  . Sexual Activity: Yes   Other Topics Concern  . Not on file   Social History Narrative    No Known Allergies   Review of Systems  Constitutional: Negative for fever, chills, weight loss, malaise/fatigue and fatigue.  HENT: Negative for ear discharge, ear pain, hoarse voice and sore throat.   Eyes: Negative for blurred vision.  Respiratory: Negative for cough, sputum production, choking, shortness of breath and wheezing.   Cardiovascular: Negative for  chest pain, palpitations, orthopnea, leg swelling and PND.  Gastrointestinal: Negative for heartburn, dysphagia, nausea, abdominal pain, diarrhea, constipation, blood in stool, melena, anorexia and flatus.  Genitourinary: Negative for dysuria, urgency, frequency and hematuria.  Musculoskeletal: Negative for myalgias, back  pain, joint pain, arthralgias, muscle weakness and neck pain.  Skin: Negative for rash.  Neurological: Negative for dizziness, tingling, sensory change, focal weakness and headaches.  Endo/Heme/Allergies: Negative for environmental allergies and polydipsia. Does not bruise/bleed easily.  Psychiatric/Behavioral: Negative for depression and suicidal ideas. The patient is not nervous/anxious and does not have insomnia.      Objective  Filed Vitals:   10/13/14 1040  BP: 130/82  Pulse: 76  Height: 6' (1.829 m)  Weight: 230 lb (104.327 kg)    Physical Exam  Constitutional: He is oriented to person, place, and time and well-developed, well-nourished, and in no distress.  HENT:  Head: Normocephalic.  Right Ear: External ear normal.  Left Ear: External ear normal.  Nose: Nose normal.  Mouth/Throat: Oropharynx is clear and moist.  Eyes: Conjunctivae and EOM are normal. Pupils are equal, round, and reactive to light. Right eye exhibits no discharge. Left eye exhibits no discharge. No scleral icterus.  Neck: Normal range of motion. Neck supple. No JVD present. No tracheal deviation present. No thyromegaly present.  Cardiovascular: Normal rate, regular rhythm, normal heart sounds and intact distal pulses.  Exam reveals no gallop and no friction rub.   No murmur heard. Pulmonary/Chest: Breath sounds normal. No respiratory distress. He has no wheezes. He has no rales.  Abdominal: Bowel sounds are normal. He exhibits no mass. There is no hepatosplenomegaly. There is tenderness. There is guarding. There is no rebound and no CVA tenderness.  Genitourinary: Prostate normal. Guaiac positive stool.  Musculoskeletal: Normal range of motion. He exhibits no edema or tenderness.  Lymphadenopathy:    He has no cervical adenopathy.  Neurological: He is alert and oriented to person, place, and time. He has normal sensation, normal strength, normal reflexes and intact cranial nerves. No cranial nerve deficit.   Skin: Skin is warm. No rash noted.  Psychiatric: Mood and affect normal.      Assessment & Plan  Problem List Items Addressed This Visit      Cardiovascular and Mediastinum   Essential hypertension   Relevant Medications   aspirin EC 81 MG tablet   metoprolol succinate (TOPROL-XL) 50 MG 24 hr tablet   amLODipine (NORVASC) 10 MG tablet     Digestive   Esophageal reflux   Relevant Medications   esomeprazole (NEXIUM) 40 MG capsule    Other Visit Diagnoses    Diverticulitis of large intestine without perforation or abscess with bleeding    -  Primary    continued tenderness/guarding LLQ/sent to er for eval    Relevant Medications    ciprofloxacin (CIPRO) 500 MG tablet    metroNIDAZOLE (FLAGYL) 500 MG tablet    Other Relevant Orders    POCT Occult Blood Stool (Completed)    Follow-up exam after treatment        [facility]         Dr. Hayden [facility]  10/13/2014

## 2014-10-13 NOTE — ED Provider Notes (Signed)
[facility] Emergency Department Provider Note  Time seen: 2:43 PM  I have reviewed the triage vital signs and the nursing notes.   HISTORY  Chief Complaint Abdominal Pain    HPI Calvin Lynch is a 49 y.o. male with a past medical history of gastric reflux, hypertension, presents the emergency department left lower quadrant abdominal pain. According to the patient he developed left lower quadrant pain on 10/01/14. He presented to the emergency department and 10/02/14, and was clinically diagnosed with diverticulitis without imaging. Patient was placed on a seven-day course of ciprofloxacin and Flagyl. Patient followed up with his primary care doctor today still complaining of left lower quadrant pain which she states is unchanged. Patient has noted some darker urine, nausea at times but denies any vomiting, diarrhea. Patient states his left lower quadrant abdominal pain as moderate, dull/aching, and unchanged. Patient has not noted any modifying factors.     Past Medical History  Diagnosis Date  . Hypertension   . GERD (gastroesophageal reflux disease)     Patient Active Problem List   Diagnosis Date Noted  . Essential hypertension 10/13/2014  . Esophageal reflux 10/13/2014    History reviewed. No pertinent past surgical history.  Current Outpatient Rx  Name  Route  Sig  Dispense  Refill  . amLODipine (NORVASC) 10 MG tablet   Oral   Take 1 tablet (10 mg total) by mouth daily at 6 (six) AM.   30 tablet   6   . aspirin EC 81 MG tablet   Oral   Take 81 mg by mouth daily at 6 (six) AM.         . ciprofloxacin (CIPRO) 500 MG tablet   Oral   Take 1 tablet (500 mg total) by mouth every 12 (twelve) hours.   14 tablet   0   . esomeprazole (NEXIUM) 40 MG capsule   Oral   Take 1 capsule (40 mg total) by mouth daily at 12 noon.   30 capsule   6   . metoprolol succinate (TOPROL-XL) 50 MG 24 hr tablet   Oral   Take 1 tablet (50 mg total) by  mouth daily at 6 (six) AM.   30 tablet   6   . metroNIDAZOLE (FLAGYL) 500 MG tablet   Oral   Take 1 tablet (500 mg total) by mouth 3 (three) times daily.   21 tablet   0     Allergies Review of patient's allergies indicates no known allergies.  Family History  Problem Relation Age of Onset  . Diabetes Mother   . Hypertension Mother     Social History Social History  Substance Use Topics  . Smoking status: Current Every Day Smoker -- 1.00 packs/day    Types: Cigarettes  . Smokeless tobacco: None  . Alcohol Use: 0.0 oz/week    0 Standard drinks or equivalent per week     Comment: pt states a 6 pack a day    Review of Systems Constitutional: Negative for fever. Cardiovascular: Negative for chest pain. Respiratory: Negative for shortness of breath. Gastrointestinal: Positive for left lower quadrant abdominal pain and nausea. Negative for vomiting or diarrhea. Genitourinary: Negative for dysuria. Patient has noted some darker urine at times. Musculoskeletal: Negative for back pain. Neurological: Negative for headache 10-point ROS otherwise negative.  ____________________________________________   PHYSICAL EXAM:  VITAL SIGNS: ED Triage Vitals  Enc Vitals Group     BP 10/13/14 1246 159/105 mmHg  Pulse Rate 10/13/14 1246 78     Resp 10/13/14 1246 18     Temp 10/13/14 1246 98.3 F (36.8 C)     Temp Source 10/13/14 1246 Oral     SpO2 10/13/14 1246 98 %     Weight 10/13/14 1246 230 lb (104.327 kg)     Height 10/13/14 1246 6' (1.829 m)     Head Cir --      Peak Flow --      Pain Score 10/13/14 1247 2     Pain Loc --      Pain Edu? --      Excl. in GC? --     Constitutional: Alert and oriented. Well appearing and in no distress. Eyes: Normal exam ENT   Mouth/Throat: Mucous membranes are moist. Cardiovascular: Normal rate, regular rhythm. No murmur Respiratory: Normal respiratory effort without tachypnea nor retractions. Breath sounds are clear and  equal bilaterally. No wheezes/rales/rhonchi. Gastrointestinal: Soft, moderate left lower quadrant tenderness palpation. No distention. No rebound or guarding. No CVA tenderness. Musculoskeletal: Nontender with normal range of motion in all extremities.  Neurologic:  Normal speech and language. No gross focal neurologic deficits  Skin:  Skin is warm, dry and intact.  Psychiatric: Mood and affect are normal. Speech and behavior are normal. Patient exhibits appropriate insight and judgment.  ____________________________________________   RADIOLOGY  CT consistent with diverticulitis.  ____________________________________________    INITIAL IMPRESSION / ASSESSMENT AND PLAN / ED COURSE  Pertinent labs & imaging results that were available during my care of the patient were reviewed by me and considered in my medical decision making (see chart for details).  Patient with continued left lower quadrant abdominal pain 12 days. We will obtain a CT scan to help further evaluate. Patient agreeable to plan. Labs largely within normal limits, CT consistent with diverticulitis. We'll place the patient on a two-week course of antibiotics, follow up with his primary care doctor next week. The patient is agreeable to plan.  ____________________________________________   FINAL CLINICAL IMPRESSION(S) / ED DIAGNOSES  Left lower quadrant abdominal pain Diverticulitis  Minna Antis, MD 10/13/14 [phone]

## 2014-10-13 NOTE — Discharge Instructions (Signed)
Diverticulitis °Diverticulitis is when small pockets that have formed in your colon (large intestine) become infected or swollen. °HOME CARE °· Follow your doctor's instructions. °· Follow a special diet if told by your doctor. °· When you feel better, your doctor may tell you to change your diet. You may be told to eat a lot of fiber. Fruits and vegetables are good sources of fiber. Fiber makes it easier to poop (have bowel movements). °· Take supplements or probiotics as told by your doctor. °· Only take medicines as told by your doctor. °· Keep all follow-up visits with your doctor. °GET HELP IF: °· Your pain does not get better. °· You have a hard time eating food. °· You are not pooping like normal. °GET HELP RIGHT AWAY IF: °· Your pain gets worse. °· Your problems do not get better. °· Your problems suddenly get worse. °· You have a fever. °· You keep throwing up (vomiting). °· You have bloody or black, tarry poop (stool). °MAKE SURE YOU:  °· Understand these instructions. °· Will watch your condition. °· Will get help right away if you are not doing well or get worse. °Document Released: 07/23/2007 Document Revised: 02/08/2013 Document Reviewed: 12/29/2012 °[employer]® Patient Information ©2015 [employer]. This information is not intended to replace advice given to you by your health care provider. Make sure you discuss any questions you have with your health care provider. ° °

## 2014-10-13 NOTE — Addendum Note (Signed)
Addended by: Elizabeth Sauer C on: 10/13/2014 11:55 AM   Modules accepted: Kipp Brood

## 2015-06-21 ENCOUNTER — Other Ambulatory Visit: Payer: Self-pay | Admitting: Family Medicine

## 2015-07-27 ENCOUNTER — Other Ambulatory Visit: Payer: Self-pay | Admitting: Family Medicine

## 2015-09-01 ENCOUNTER — Other Ambulatory Visit: Payer: Self-pay | Admitting: Family Medicine

## 2015-09-26 NOTE — Telephone Encounter (Signed)
Unable to reach pt, called pts wife gave me a number to call and still no return phone calls from pt

## 2015-10-01 ENCOUNTER — Other Ambulatory Visit: Payer: Self-pay

## 2015-10-02 ENCOUNTER — Ambulatory Visit (INDEPENDENT_AMBULATORY_CARE_PROVIDER_SITE_OTHER): Payer: PRIVATE HEALTH INSURANCE | Admitting: Family Medicine

## 2015-10-02 ENCOUNTER — Encounter: Payer: Self-pay | Admitting: Family Medicine

## 2015-10-02 VITALS — BP 132/80 | HR 64 | Ht 72.0 in | Wt 247.0 lb

## 2015-10-02 DIAGNOSIS — K219 Gastro-esophageal reflux disease without esophagitis: Secondary | ICD-10-CM | POA: Diagnosis not present

## 2015-10-02 DIAGNOSIS — I10 Essential (primary) hypertension: Secondary | ICD-10-CM

## 2015-10-02 MED ORDER — METOPROLOL SUCCINATE ER 50 MG PO TB24
ORAL_TABLET | ORAL | 6 refills | Status: DC
Start: 2015-10-02 — End: 2016-04-22

## 2015-10-02 MED ORDER — PANTOPRAZOLE SODIUM 40 MG PO TBEC: 40.0000 mg | DELAYED_RELEASE_TABLET | Freq: Every day | ORAL | 6 refills | Status: DC

## 2015-10-02 MED ORDER — AMLODIPINE BESYLATE 10 MG PO TABS: ORAL_TABLET | ORAL | 6 refills | Status: DC

## 2015-10-02 NOTE — Progress Notes (Signed)
Name: Renae Ficklentonio S Fullington   MRN: [id number]    DOB: 10/13/1965   Date:10/02/2015       Progress Note  Subjective  Chief Complaint  Chief Complaint  Patient presents with  . Gastroesophageal Reflux  . Hypertension    Gastroesophageal Reflux  He reports no abdominal pain, no belching, no chest pain, no choking, no coughing, no dysphagia, no early satiety, no globus sensation, no heartburn, no hoarse voice, no nausea, no sore throat, no stridor, no tooth decay, no water brash or no wheezing. This is a chronic problem. The current episode started more than 1 year ago. The problem occurs frequently. The problem has been gradually improving. Nothing aggravates the symptoms. Pertinent negatives include no anemia, fatigue, melena, muscle weakness, orthopnea or weight loss. There are no known risk factors. He has tried a PPI for the symptoms. The treatment provided moderate relief.  Hypertension  This is a chronic problem. The current episode started more than 1 year ago. The problem has been gradually improving since onset. The problem is controlled. Pertinent negatives include no blurred vision, chest pain, headaches, malaise/fatigue, neck pain, palpitations or shortness of breath. There are no associated agents to hypertension. Past treatments include beta blockers and calcium channel blockers. The current treatment provides moderate improvement. There are no compliance problems.  There is no history of angina, kidney disease, CAD/MI, CVA, heart failure, left ventricular hypertrophy, PVD, renovascular disease or retinopathy. There is no history of chronic renal disease or a hypertension causing med.    No problem-specific Assessment & Plan notes found for this encounter.   Past Medical History:  Diagnosis Date  . GERD (gastroesophageal reflux disease)   . Hypertension     No past surgical history on file.  Family History  Problem Relation Age of Onset  . Diabetes Mother   . Hypertension Mother      Social History   Social History  . Marital status: Married    Spouse name: N/A  . Number of children: N/A  . Years of education: N/A   Occupational History  . Not on file.   Social History Main Topics  . Smoking status: Current Every Day Smoker    Packs/day: 1.00    Types: Cigarettes  . Smokeless tobacco: Not on file  . Alcohol use 0.0 oz/week     Comment: pt states a 6 pack a day  . Drug use: No  . Sexual activity: Yes   Other Topics Concern  . Not on file   Social History Narrative  . No narrative on file    No Known Allergies   Review of Systems  Constitutional: Negative for chills, fatigue, fever, malaise/fatigue and weight loss.  HENT: Negative for ear discharge, ear pain, hoarse voice and sore throat.   Eyes: Negative for blurred vision.  Respiratory: Negative for cough, sputum production, choking, shortness of breath and wheezing.   Cardiovascular: Negative for chest pain, palpitations and leg swelling.  Gastrointestinal: Negative for abdominal pain, blood in stool, constipation, diarrhea, dysphagia, heartburn, melena and nausea.  Genitourinary: Negative for dysuria, frequency, hematuria and urgency.  Musculoskeletal: Negative for back pain, joint pain, myalgias, muscle weakness and neck pain.  Skin: Negative for rash.  Neurological: Negative for dizziness, tingling, sensory change, focal weakness and headaches.  Endo/Heme/Allergies: Negative for environmental allergies and polydipsia. Does not bruise/bleed easily.  Psychiatric/Behavioral: Negative for depression and suicidal ideas. The patient is not nervous/anxious and does not have insomnia.      Objective  Vitals:   10/02/15 0913  BP: 132/80  Pulse: 64  Weight: 247 lb (112 kg)  Height: 6' (1.829 m)    Physical Exam  Constitutional: He is oriented to person, place, and time and well-developed, well-nourished, and in no distress.  HENT:  Head: Normocephalic.  Right Ear: External ear normal.   Left Ear: External ear normal.  Nose: Nose normal.  Mouth/Throat: Oropharynx is clear and moist.  Eyes: Conjunctivae and EOM are normal. Pupils are equal, round, and reactive to light. Right eye exhibits no discharge. Left eye exhibits no discharge. No scleral icterus.  Neck: Normal range of motion. Neck supple. No JVD present. No tracheal deviation present. No thyromegaly present.  Cardiovascular: Normal rate, regular rhythm, normal heart sounds and intact distal pulses.  Exam reveals no gallop and no friction rub.   No murmur heard. Pulmonary/Chest: Breath sounds normal. No respiratory distress. He has no wheezes. He has no rales.  Abdominal: Soft. Bowel sounds are normal. He exhibits no mass. There is no hepatosplenomegaly. There is no tenderness. There is no rebound, no guarding and no CVA tenderness.  Musculoskeletal: Normal range of motion. He exhibits no edema or tenderness.  Lymphadenopathy:    He has no cervical adenopathy.  Neurological: He is alert and oriented to person, place, and time. He has normal sensation, normal strength and intact cranial nerves. No cranial nerve deficit.  Skin: Skin is warm. No rash noted.  Psychiatric: Mood and affect normal.  Nursing note and vitals reviewed.     Assessment & Plan  Problem List Items Addressed This Visit      Cardiovascular and Mediastinum   Essential hypertension - Primary   Relevant Medications   amLODipine (NORVASC) 10 MG tablet   metoprolol succinate (TOPROL-XL) 50 MG 24 hr tablet   Other Relevant Orders   Renal Function Panel     Digestive   Esophageal reflux   Relevant Medications   pantoprazole (PROTONIX) 40 MG tablet    Other Visit Diagnoses   None.       Dr. Hayden Rasmusseneanna Onita Pfluger [facility]  10/02/15

## 2015-10-03 LAB — RENAL FUNCTION PANEL
Albumin: 4.3 g/dL (ref 3.5–5.5)
BUN/Creatinine Ratio: 12 (ref 9–20)
BUN: 14 mg/dL (ref 6–24)
CO2: 21 mmol/L (ref 18–29)
Calcium: 9.4 mg/dL (ref 8.7–10.2)
Chloride: 100 mmol/L (ref 96–106)
Creatinine, Ser: 1.21 mg/dL (ref 0.76–1.27)
GFR calc Af Amer: 81 mL/min/{1.73_m2} (ref >59)
GFR calc non Af Amer: 70 mL/min/{1.73_m2} (ref >59)
Glucose: 83 mg/dL (ref 65–99)
Phosphorus: 3.6 mg/dL (ref 2.5–4.5)
Potassium: 4.5 mmol/L (ref 3.5–5.2)
Sodium: 142 mmol/L (ref 134–144)

## 2016-04-22 ENCOUNTER — Ambulatory Visit (INDEPENDENT_AMBULATORY_CARE_PROVIDER_SITE_OTHER): Payer: BLUE CROSS/BLUE SHIELD | Admitting: Family Medicine

## 2016-04-22 ENCOUNTER — Encounter: Payer: Self-pay | Admitting: Family Medicine

## 2016-04-22 VITALS — BP 140/78 | HR 80 | Ht 72.0 in | Wt 240.0 lb

## 2016-04-22 DIAGNOSIS — I10 Essential (primary) hypertension: Secondary | ICD-10-CM

## 2016-04-22 DIAGNOSIS — K219 Gastro-esophageal reflux disease without esophagitis: Secondary | ICD-10-CM | POA: Diagnosis not present

## 2016-04-22 DIAGNOSIS — M519 Unspecified thoracic, thoracolumbar and lumbosacral intervertebral disc disorder: Secondary | ICD-10-CM | POA: Diagnosis not present

## 2016-04-22 DIAGNOSIS — J0100 Acute maxillary sinusitis, unspecified: Secondary | ICD-10-CM | POA: Diagnosis not present

## 2016-04-22 MED ORDER — METOPROLOL SUCCINATE ER 50 MG PO TB24: ORAL_TABLET | ORAL | 6 refills | Status: DC

## 2016-04-22 MED ORDER — PREDNISONE 10 MG PO TABS: 10.0000 mg | ORAL_TABLET | Freq: Every day | ORAL | 0 refills | Status: DC

## 2016-04-22 MED ORDER — AMLODIPINE BESYLATE 10 MG PO TABS: ORAL_TABLET | ORAL | 6 refills | Status: DC

## 2016-04-22 MED ORDER — AMOXICILLIN 500 MG PO CAPS: 500.0000 mg | ORAL_CAPSULE | Freq: Three times a day (TID) | ORAL | 0 refills | Status: DC

## 2016-04-22 MED ORDER — PANTOPRAZOLE SODIUM 40 MG PO TBEC: 40.0000 mg | DELAYED_RELEASE_TABLET | Freq: Every day | ORAL | 6 refills | Status: DC

## 2016-04-22 MED ORDER — CYCLOBENZAPRINE HCL 10 MG PO TABS: 10.0000 mg | ORAL_TABLET | Freq: Every day | ORAL | 0 refills | Status: DC

## 2016-04-22 MED ORDER — MELOXICAM 15 MG PO TABS: 15.0000 mg | ORAL_TABLET | Freq: Every day | ORAL | 0 refills | Status: DC

## 2016-04-22 NOTE — Progress Notes (Signed)
Name: Calvin Lynch   MRN: [id number]    DOB: August 14, 1965   Date:04/22/2016       Progress Note  Subjective  Chief Complaint  Chief Complaint  Patient presents with  . Back Pain    more in the R) buttocks- pain radiates down R) leg and is a numbness in leg. Gets worse the longer he stands on feet. Feels better when sitting.  . Sinusitis    cong, watery eyes, no cough  . Hypertension  . Gastroesophageal Reflux    Back Pain  This is a new problem. The current episode started 1 to 4 weeks ago. The problem occurs constantly. The problem has been waxing and waning since onset. The pain is present in the lumbar spine. The quality of the pain is described as aching. The pain radiates to the right thigh. The pain is at a severity of 8/10. The pain is moderate. The symptoms are aggravated by standing and twisting. Associated symptoms include headaches, leg pain and numbness. Pertinent negatives include no abdominal pain, bladder incontinence, bowel incontinence, chest pain, dysuria, fever, paresis, paresthesias, tingling, weakness or weight loss. He has tried nothing for the symptoms.  Sinusitis  The current episode started 1 to 4 weeks ago. The problem has been waxing and waning since onset. There has been no fever. Associated symptoms include chills, congestion, ear pain, headaches and sinus pressure. Pertinent negatives include no coughing, hoarse voice, neck pain, shortness of breath, sneezing, sore throat or swollen glands. Treatments tried: ear drops. The treatment provided mild relief.  Hypertension  This is a chronic problem. The problem has been gradually improving since onset. The problem is controlled. Associated symptoms include headaches. Pertinent negatives include no blurred vision, chest pain, malaise/fatigue, neck pain, palpitations, PND or shortness of breath. There are no associated agents to hypertension. Risk factors for coronary artery disease include male gender and obesity. Past  treatments include calcium channel blockers and beta blockers. The current treatment provides moderate improvement. There are no compliance problems.  There is no history of angina, kidney disease, CAD/MI, CVA, heart failure, left ventricular hypertrophy, PVD, renovascular disease or retinopathy. There is no history of chronic renal disease or a hypertension causing med.  Gastroesophageal Reflux  He reports no abdominal pain, no belching, no chest pain, no choking, no coughing, no dysphagia, no heartburn, no hoarse voice, no nausea, no sore throat or no wheezing. This is a chronic problem. The problem has been gradually improving. Nothing aggravates the symptoms. Pertinent negatives include no anemia, fatigue, melena, muscle weakness, orthopnea or weight loss. There are no known risk factors. He has tried a PPI for the symptoms. The treatment provided moderate relief.    No problem-specific Assessment & Plan notes found for this encounter.   Past Medical History:  Diagnosis Date  . GERD (gastroesophageal reflux disease)   . Hypertension     No past surgical history on file.  Family History  Problem Relation Age of Onset  . Diabetes Mother   . Hypertension Mother     Social History   Social History  . Marital status: Married    Spouse name: N/A  . Number of children: N/A  . Years of education: N/A   Occupational History  . Not on file.   Social History Main Topics  . Smoking status: Current Every Day Smoker    Packs/day: 1.00    Types: Cigarettes  . Smokeless tobacco: Former Neurosurgeon  . Alcohol use 0.0 oz/week  Comment: pt states a 6 pack a day  . Drug use: No  . Sexual activity: Yes   Other Topics Concern  . Not on file   Social History Narrative  . No narrative on file    No Known Allergies  Outpatient Medications Prior to Visit  Medication Sig Dispense Refill  . aspirin EC 81 MG tablet Take 81 mg by mouth daily at 6 (six) AM.    . amLODipine (NORVASC) 10 MG  tablet TAKE 1 TABLET (10 MG TOTAL) BY MOUTH DAILY AT 6 (SIX) AM. 30 tablet 6  . metoprolol succinate (TOPROL-XL) 50 MG 24 hr tablet TAKE 1 TABLET (50 MG TOTAL) BY MOUTH DAILY AT 6 (SIX) AM. 30 tablet 6  . pantoprazole (PROTONIX) 40 MG tablet Take 1 tablet (40 mg total) by mouth daily. 30 tablet 6   No facility-administered medications prior to visit.     Review of Systems  Constitutional: Positive for chills. Negative for fatigue, fever, malaise/fatigue and weight loss.  HENT: Positive for congestion, ear pain and sinus pressure. Negative for ear discharge, hoarse voice, sneezing and sore throat.   Eyes: Negative for blurred vision.  Respiratory: Negative for cough, sputum production, choking, shortness of breath and wheezing.   Cardiovascular: Negative for chest pain, palpitations, leg swelling and PND.  Gastrointestinal: Negative for abdominal pain, blood in stool, bowel incontinence, constipation, diarrhea, dysphagia, heartburn, melena and nausea.  Genitourinary: Negative for bladder incontinence, dysuria, frequency, hematuria and urgency.  Musculoskeletal: Positive for back pain. Negative for joint pain, myalgias, muscle weakness and neck pain.  Skin: Negative for rash.  Neurological: Positive for numbness and headaches. Negative for dizziness, tingling, sensory change, focal weakness, weakness and paresthesias.  Endo/Heme/Allergies: Negative for environmental allergies and polydipsia. Does not bruise/bleed easily.  Psychiatric/Behavioral: Negative for depression and suicidal ideas. The patient is not nervous/anxious and does not have insomnia.      Objective  Vitals:   04/22/16 1533  BP: 140/78  Pulse: 80  Weight: 240 lb (108.9 kg)  Height: 6' (1.829 m)    Physical Exam  Constitutional: He is oriented to person, place, and time and well-developed, well-nourished, and in no distress.  HENT:  Head: Normocephalic.  Right Ear: External ear normal.  Left Ear: External ear  normal.  Nose: Nose normal.  Mouth/Throat: Oropharynx is clear and moist.  Eyes: Conjunctivae and EOM are normal. Pupils are equal, round, and reactive to light. Right eye exhibits no discharge. Left eye exhibits no discharge. No scleral icterus.  Neck: Normal range of motion. Neck supple. No JVD present. No tracheal deviation present. No thyromegaly present.  Cardiovascular: Normal rate, regular rhythm, normal heart sounds and intact distal pulses.  Exam reveals no gallop and no friction rub.   No murmur heard. Pulmonary/Chest: Breath sounds normal. No respiratory distress. He has no wheezes. He has no rales.  Abdominal: Soft. Bowel sounds are normal. He exhibits no mass. There is no hepatosplenomegaly. There is no tenderness. There is no rebound, no guarding and no CVA tenderness.  Musculoskeletal: Normal range of motion. He exhibits no edema or tenderness.  Lymphadenopathy:    He has no cervical adenopathy.  Neurological: He is alert and oriented to person, place, and time. He has normal sensation, normal strength and intact cranial nerves. No cranial nerve deficit.  Skin: Skin is warm. No rash noted.  Psychiatric: Mood and affect normal.  Nursing note and vitals reviewed.     Assessment & Plan  Problem List Items Addressed This  Visit      Cardiovascular and Mediastinum   Essential hypertension - Primary   Relevant Medications   amLODipine (NORVASC) 10 MG tablet   metoprolol succinate (TOPROL-XL) 50 MG 24 hr tablet   Other Relevant Orders   Renal Function Panel     Digestive   Esophageal reflux   Relevant Medications   pantoprazole (PROTONIX) 40 MG tablet    Other Visit Diagnoses    Acute maxillary sinusitis, recurrence not specified       Relevant Medications   predniSONE (DELTASONE) 10 MG tablet   amoxicillin (AMOXIL) 500 MG capsule   Disc disorder of lumbar region       Relevant Medications   meloxicam (MOBIC) 15 MG tablet   cyclobenzaprine (FLEXERIL) 10 MG tablet    predniSONE (DELTASONE) 10 MG tablet      Meds ordered this encounter  Medications  . pantoprazole (PROTONIX) 40 MG tablet    Sig: Take 1 tablet (40 mg total) by mouth daily.    Dispense:  30 tablet    Refill:  6  . amLODipine (NORVASC) 10 MG tablet    Sig: TAKE 1 TABLET (10 MG TOTAL) BY MOUTH DAILY AT 6 (SIX) AM.    Dispense:  30 tablet    Refill:  6  . metoprolol succinate (TOPROL-XL) 50 MG 24 hr tablet    Sig: TAKE 1 TABLET (50 MG TOTAL) BY MOUTH DAILY AT 6 (SIX) AM.    Dispense:  30 tablet    Refill:  6    sched appt  . meloxicam (MOBIC) 15 MG tablet    Sig: Take 1 tablet (15 mg total) by mouth daily.    Dispense:  30 tablet    Refill:  0  . cyclobenzaprine (FLEXERIL) 10 MG tablet    Sig: Take 1 tablet (10 mg total) by mouth at bedtime.    Dispense:  30 tablet    Refill:  0  . predniSONE (DELTASONE) 10 MG tablet    Sig: Take 1 tablet (10 mg total) by mouth daily with breakfast.    Dispense:  30 tablet    Refill:  0  . amoxicillin (AMOXIL) 500 MG capsule    Sig: Take 1 capsule (500 mg total) by mouth 3 (three) times daily.    Dispense:  30 capsule    Refill:  0      Dr. Elizabeth Sauereanna Joi Leyva [facility]  04/22/16

## 2016-04-23 DIAGNOSIS — I10 Essential (primary) hypertension: Secondary | ICD-10-CM | POA: Diagnosis not present

## 2016-04-24 LAB — RENAL FUNCTION PANEL
Albumin: 4.8 g/dL (ref 3.5–5.5)
BUN/Creatinine Ratio: 14 (ref 9–20)
BUN: 15 mg/dL (ref 6–24)
CO2: 26 mmol/L (ref 18–29)
Calcium: 9.8 mg/dL (ref 8.7–10.2)
Chloride: 97 mmol/L (ref 96–106)
Creatinine, Ser: 1.11 mg/dL (ref 0.76–1.27)
GFR calc Af Amer: 90 mL/min/{1.73_m2} (ref >59)
GFR calc non Af Amer: 78 mL/min/{1.73_m2} (ref >59)
Glucose: 103 mg/dL — ABNORMAL HIGH (ref 65–99)
Phosphorus: 2.5 mg/dL (ref 2.5–4.5)
Potassium: 4.6 mmol/L (ref 3.5–5.2)
Sodium: 137 mmol/L (ref 134–144)

## 2016-05-19 ENCOUNTER — Other Ambulatory Visit: Payer: Self-pay | Admitting: Family Medicine

## 2016-05-19 DIAGNOSIS — M519 Unspecified thoracic, thoracolumbar and lumbosacral intervertebral disc disorder: Secondary | ICD-10-CM

## 2016-10-15 ENCOUNTER — Ambulatory Visit
Admission: EM | Admit: 2016-10-15 | Discharge: 2016-10-15 | Disposition: A | Discharge status: Home / Self Care | Payer: BLUE CROSS/BLUE SHIELD | Attending: Emergency Medicine | Admitting: Emergency Medicine

## 2016-10-15 ENCOUNTER — Encounter: Payer: Self-pay | Admitting: Emergency Medicine

## 2016-10-15 DIAGNOSIS — Z23 Encounter for immunization: Secondary | ICD-10-CM

## 2016-10-15 DIAGNOSIS — R238 Other skin changes: Secondary | ICD-10-CM

## 2016-10-15 DIAGNOSIS — L03115 Cellulitis of right lower limb: Secondary | ICD-10-CM | POA: Diagnosis not present

## 2016-10-15 DIAGNOSIS — W57XXXA Bitten or stung by nonvenomous insect and other nonvenomous arthropods, initial encounter: Secondary | ICD-10-CM | POA: Diagnosis not present

## 2016-10-15 MED ORDER — SULFAMETHOXAZOLE-TRIMETHOPRIM 800-160 MG PO TABS: 2.0000 | ORAL_TABLET | Freq: Two times a day (BID) | ORAL | 0 refills | Status: AC

## 2016-10-15 MED ORDER — TETANUS-DIPHTH-ACELL PERTUSSIS 5-2.5-18.5 LF-MCG/0.5 IM SUSP
0.5000 mL | Freq: Once | INTRAMUSCULAR | Status: AC
  Administered 2016-10-15: 0.5 mL via INTRAMUSCULAR

## 2016-10-15 NOTE — ED Provider Notes (Signed)
HPI  SUBJECTIVE:  Calvin Lynch is a 51 y.o. male who presents with insect bites to his right ankle. States that he was bitten by "something" 3 days ago but is not sure what. 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 States that he felt the bite and it was followed by burning, itching. States it blistered the next day. He reports constant soreness in the area. The burning and itching has since resolved. He reports erythema, swelling. No fevers, bodyaches, no contacts with similar rash. He reports some clear drainage from the larger blister. There are no aggravating or alleviating factors. He has not tried anything for this. Past medical history of hypertension no history of diabetes, MRSA,  Keloids. Tetanus unknown. [id number]:[id number], Vanita Panda, MD  Past Medical History:  Diagnosis Date  . GERD (gastroesophageal reflux disease)   . Hypertension     History reviewed. No pertinent surgical history.  Family History  Problem Relation Age of Onset  . Diabetes Mother   . Hypertension Mother     Social History  Substance Use Topics  . Smoking status: Current Every Day Smoker    Packs/day: 1.00    Types: Cigarettes  . Smokeless tobacco: Former Neurosurgeon  . Alcohol use 0.0 oz/week     Comment: pt states a 6 pack a day    No current facility-administered medications for this encounter.   Current Outpatient Prescriptions:  .  amLODipine (NORVASC) 10 MG tablet, TAKE 1 TABLET (10 MG TOTAL) BY MOUTH DAILY AT 6 (SIX) AM., Disp: 30 tablet, Rfl: 6 .  amoxicillin (AMOXIL) 500 MG capsule, Take 1 capsule (500 mg total) by mouth 3 (three) times daily., Disp: 30 capsule, Rfl: 0 .  aspirin EC 81 MG tablet, Take 81 mg by mouth daily at 6 (six) AM., Disp: , Rfl:  .  metoprolol succinate (TOPROL-XL) 50 MG 24 hr tablet, TAKE 1 TABLET (50 MG TOTAL) BY MOUTH DAILY AT 6 (SIX) AM., Disp: 30 tablet, Rfl: 6 .  pantoprazole (PROTONIX) 40 MG tablet, Take 1 tablet (40 mg total) by mouth daily., Disp:  30 tablet, Rfl: 6 .  sulfamethoxazole-trimethoprim (BACTRIM DS,SEPTRA DS) 800-160 MG tablet, Take 2 tablets by mouth 2 (two) times daily., Disp: 20 tablet, Rfl: 0  No Known Allergies   ROS  As noted in HPI.   Physical Exam  BP (!) 163/95 (BP Location: Right Arm)   Pulse 78   Temp 98.4 F (36.9 C) (Oral)   Resp 16   Ht 6' (1.829 m)   Wt 240 lb (108.9 kg)   SpO2 99%   BMI 32.55 kg/m   Constitutional: Well developed, well nourished, no acute distress Eyes:  EOMI, conjunctiva normal bilaterally HENT: Normocephalic, atraumatic,mucus membranes moist Respiratory: Normal inspiratory effort Cardiovascular: Normal rate GI: nondistended skin: 2 mildly tender intact bullae with surrounding swelling. No erythema , increased temperature. One measures 0.5 x 0.5 cm, the other measures 2 x 2 cm. Marked blisters with a skin marker for reference See pictures.         Musculoskeletal: no deformities Neurologic: Alert & oriented x 3, no focal neuro deficits Psychiatric: Speech and behavior appropriate   ED Course   Medications  Tdap (BOOSTRIX) injection 0.5 mL (0.5 mLs Intramuscular Given 10/15/16 5409)    Orders Placed This Encounter  Procedures  . Wound or Superficial Culture    Standing Status:   Standing    Number of Occurrences:   1    Order  Specific Question:   Patient immune status    Answer:   Normal    No results found for this or any previous visit (from the past 24 hour(s)). No results found.  ED Clinical Impression  Insect bite, initial encounter  Cellulitis of right lower extremity  Bullous lesion   ED Assessment/Plan  Procedure note. Cleaned area with alcohol. Using a sterile 18-gauge needle made a single stab incision parallel to the skin of the small bullae. Drained small amount of serosanguineous material. No expressible purulent material. Sending off for culture. Applied bacitracin and Band-Aid. Patient tolerated procedure well.  Updating  tetanus.  Presentation consistent with a secondary staph or strep infection post insect bite. We'll send home with Bactrim 2 tabs by mouth twice a day for 5 days which will also cover MRSA. Patient to do local wound care the remaining bulla. Apply bacitracin when it ruptures. He'll follow-up with his primary care physician or here in several days, to the ER if he gets worse  Discussed labs,MDM, plan and followup with patient . Discussed sn/sx that should prompt return to the ED. Patient agrees with plan.   Meds ordered this encounter  Medications  . Tdap (BOOSTRIX) injection 0.5 mL  . sulfamethoxazole-trimethoprim (BACTRIM DS,SEPTRA DS) 800-160 MG tablet    Sig: Take 2 tablets by mouth 2 (two) times daily.    Dispense:  20 tablet    Refill:  0    *This clinic note was created using Scientist, clinical (histocompatibility and immunogenetics)Dragon dictation software. Therefore, there may be occasional mistakes despite careful proofreading.  ?   Domenick GongMortenson, Manasi Dishon, MD 10/15/16 [phone]

## 2016-10-15 NOTE — Discharge Instructions (Signed)
keep the 2 areas clean with soap and water, apply bacitracin and an appropriate bandage over these until they heal.

## 2016-10-15 NOTE — ED Triage Notes (Signed)
Patient states that he got bit by an insect on Sunday on his right foot.  Patient reports swelling and tenderness at the site.

## 2016-10-18 LAB — AEROBIC CULTURE W GRAM STAIN (SUPERFICIAL SPECIMEN)
Culture: NO GROWTH
Special Requests: NORMAL

## 2016-11-04 ENCOUNTER — Encounter: Payer: Self-pay | Admitting: Family Medicine

## 2016-11-04 ENCOUNTER — Ambulatory Visit (INDEPENDENT_AMBULATORY_CARE_PROVIDER_SITE_OTHER): Payer: BLUE CROSS/BLUE SHIELD | Admitting: Family Medicine

## 2016-11-04 VITALS — BP 162/88 | HR 80 | Ht 72.0 in | Wt 244.0 lb

## 2016-11-04 DIAGNOSIS — K219 Gastro-esophageal reflux disease without esophagitis: Secondary | ICD-10-CM

## 2016-11-04 DIAGNOSIS — I10 Essential (primary) hypertension: Secondary | ICD-10-CM | POA: Diagnosis not present

## 2016-11-04 DIAGNOSIS — S90561D Insect bite (nonvenomous), right ankle, subsequent encounter: Secondary | ICD-10-CM | POA: Diagnosis not present

## 2016-11-04 DIAGNOSIS — W57XXXA Bitten or stung by nonvenomous insect and other nonvenomous arthropods, initial encounter: Secondary | ICD-10-CM

## 2016-11-04 MED ORDER — PANTOPRAZOLE SODIUM 40 MG PO TBEC: 40.0000 mg | DELAYED_RELEASE_TABLET | Freq: Every day | ORAL | 6 refills | Status: DC

## 2016-11-04 MED ORDER — METOPROLOL SUCCINATE ER 100 MG PO TB24: 100.0000 mg | ORAL_TABLET | Freq: Every day | ORAL | 6 refills | Status: DC

## 2016-11-04 MED ORDER — MUPIROCIN 2 % EX OINT: 1.0000 "application " | TOPICAL_OINTMENT | Freq: Two times a day (BID) | CUTANEOUS | 0 refills | Status: DC

## 2016-11-04 MED ORDER — AMOXICILLIN-POT CLAVULANATE 875-125 MG PO TABS: 1.0000 | ORAL_TABLET | Freq: Two times a day (BID) | ORAL | 0 refills | Status: DC

## 2016-11-04 MED ORDER — AMLODIPINE BESYLATE 10 MG PO TABS: ORAL_TABLET | ORAL | 6 refills | Status: DC

## 2016-11-04 NOTE — Progress Notes (Addendum)
Name: Calvin Lynch   MRN: [id number]    DOB: May 31, 1965   Date:11/05/2016       Progress Note  Subjective  Chief Complaint  Chief Complaint  Patient presents with  . Insect Bite    under ankle of R) foot - came up like a blister/ popped on it's own and looks like it is coming back- draining     Hypertension  This is a chronic problem. The current episode started more than 1 year ago. The problem has been waxing and waning since onset. The problem is uncontrolled. Pertinent negatives include no anxiety, blurred vision, chest pain, headaches, malaise/fatigue, neck pain, orthopnea, palpitations, peripheral edema, PND, shortness of breath or sweats. There are no associated agents to hypertension. Risk factors for coronary artery disease include obesity. Past treatments include beta blockers and calcium channel blockers. The current treatment provides moderate improvement. There are no compliance problems.  There is no history of angina, kidney disease, CAD/MI, CVA, heart failure, left ventricular hypertrophy, PVD or retinopathy. There is no history of chronic renal disease, a hypertension causing med or renovascular disease.  Gastroesophageal Reflux  He reports no abdominal pain, no chest pain, no coughing, no heartburn, no nausea, no sore throat or no wheezing. The problem has been waxing and waning. The symptoms are aggravated by certain foods. Pertinent negatives include no melena or weight loss. He has tried a PPI for the symptoms. The treatment provided moderate relief.  Rash  This is a new problem. The current episode started 1 to 4 weeks ago (noted vesicle 10/25/16). The problem has been gradually worsening since onset. The affected locations include the right ankle. The rash is characterized by redness and draining. He was exposed to an insect bite/sting (began as vesicle/suspected insect bite). Pertinent negatives include no cough, diarrhea, fever, joint pain, shortness of breath or sore  throat. Past treatments include nothing.    No problem-specific Assessment & Plan notes found for this encounter.   Past Medical History:  Diagnosis Date  . GERD (gastroesophageal reflux disease)   . Hypertension     No past surgical history on file.  Family History  Problem Relation Age of Onset  . Diabetes Mother   . Hypertension Mother     Social History   Social History  . Marital status: Married    Spouse name: N/A  . Number of children: N/A  . Years of education: N/A   Occupational History  . Not on file.   Social History Main Topics  . Smoking status: Current Every Day Smoker    Packs/day: 1.00    Types: Cigarettes  . Smokeless tobacco: Former Neurosurgeon  . Alcohol use 0.0 oz/week     Comment: pt states a 6 pack a day  . Drug use: No  . Sexual activity: Yes   Other Topics Concern  . Not on file   Social History Narrative  . No narrative on file    No Known Allergies  Outpatient Medications Prior to Visit  Medication Sig Dispense Refill  . aspirin EC 81 MG tablet Take 81 mg by mouth daily at 6 (six) AM.    . amLODipine (NORVASC) 10 MG tablet TAKE 1 TABLET (10 MG TOTAL) BY MOUTH DAILY AT 6 (SIX) AM. 30 tablet 6  . metoprolol succinate (TOPROL-XL) 50 MG 24 hr tablet TAKE 1 TABLET (50 MG TOTAL) BY MOUTH DAILY AT 6 (SIX) AM. 30 tablet 6  . pantoprazole (PROTONIX) 40 MG tablet Take 1 tablet (  40 mg total) by mouth daily. 30 tablet 6  . amoxicillin (AMOXIL) 500 MG capsule Take 1 capsule (500 mg total) by mouth 3 (three) times daily. 30 capsule 0   No facility-administered medications prior to visit.     Review of Systems  Constitutional: Negative for chills, fever, malaise/fatigue and weight loss.  HENT: Negative for ear discharge, ear pain and sore throat.   Eyes: Negative for blurred vision.  Respiratory: Negative for cough, sputum production, shortness of breath and wheezing.   Cardiovascular: Negative for chest pain, palpitations, orthopnea, leg swelling  and PND.  Gastrointestinal: Negative for abdominal pain, blood in stool, constipation, diarrhea, heartburn, melena and nausea.  Genitourinary: Negative for dysuria, frequency, hematuria and urgency.  Musculoskeletal: Negative for back pain, joint pain, myalgias and neck pain.  Skin: Negative for rash.  Neurological: Negative for dizziness, tingling, sensory change, focal weakness and headaches.  Endo/Heme/Allergies: Negative for environmental allergies and polydipsia. Does not bruise/bleed easily.  Psychiatric/Behavioral: Negative for depression and suicidal ideas. The patient is not nervous/anxious and does not have insomnia.      Objective  Vitals:   11/04/16 1113  BP: (!) 162/88  Pulse: 80  Weight: 244 lb (110.7 kg)  Height: 6' (1.829 m)    Physical Exam  Constitutional: He is oriented to person, place, and time and well-developed, well-nourished, and in no distress.  HENT:  Head: Normocephalic.  Right Ear: External ear normal.  Left Ear: External ear normal.  Nose: Nose normal.  Mouth/Throat: Oropharynx is clear and moist.  Eyes: Pupils are equal, round, and reactive to light. Conjunctivae and EOM are normal. Right eye exhibits no discharge. Left eye exhibits no discharge. No scleral icterus.  Neck: Normal range of motion. Neck supple. No JVD present. No tracheal deviation present. No thyromegaly present.  Cardiovascular: Normal rate, regular rhythm, normal heart sounds and intact distal pulses.  Exam reveals no gallop and no friction rub.   No murmur heard. Pulmonary/Chest: Breath sounds normal. No respiratory distress. He has no wheezes. He has no rales.  Abdominal: Soft. Bowel sounds are normal. He exhibits no mass. There is no hepatosplenomegaly. There is no tenderness. There is no rebound, no guarding and no CVA tenderness.  Musculoskeletal: Normal range of motion. He exhibits no edema or tenderness.  Lymphadenopathy:    He has no cervical adenopathy.  Neurological: He  is alert and oriented to person, place, and time. He has normal sensation, normal strength, normal reflexes and intact cranial nerves. No cranial nerve deficit.  Skin: Skin is warm. Rash noted. Rash is vesicular.  Serous drainage/central opening/ keloid formation outer1.5 cm diameter  Psychiatric: Mood and affect normal.  Nursing note and vitals reviewed.     Assessment & Plan  Problem List Items Addressed This Visit      Cardiovascular and Mediastinum   Essential hypertension   Relevant Medications   metoprolol succinate (TOPROL-XL) 100 MG 24 hr tablet   amLODipine (NORVASC) 10 MG tablet     Digestive   Esophageal reflux   Relevant Medications   pantoprazole (PROTONIX) 40 MG tablet    Other Visit Diagnoses    Insect bite, initial encounter    -  Primary   Relevant Medications   amoxicillin-clavulanate (AUGMENTIN) 875-125 MG tablet   mupirocin ointment (BACTROBAN) 2 %      Meds ordered this encounter  Medications  . amoxicillin-clavulanate (AUGMENTIN) 875-125 MG tablet    Sig: Take 1 tablet by mouth 2 (two) times daily.  Dispense:  20 tablet    Refill:  0  . metoprolol succinate (TOPROL-XL) 100 MG 24 hr tablet    Sig: Take 1 tablet (100 mg total) by mouth daily. Take with or immediately following a meal.    Dispense:  30 tablet    Refill:  6  . amLODipine (NORVASC) 10 MG tablet    Sig: TAKE 1 TABLET (10 MG TOTAL) BY MOUTH DAILY AT 6 (SIX) AM.    Dispense:  30 tablet    Refill:  6  . pantoprazole (PROTONIX) 40 MG tablet    Sig: Take 1 tablet (40 mg total) by mouth daily.    Dispense:  30 tablet    Refill:  6  . mupirocin ointment (BACTROBAN) 2 %    Sig: Apply 1 application topically 2 (two) times daily.    Dispense:  22 g    Refill:  0      Dr. [facility]  11/05/16

## 2016-11-18 ENCOUNTER — Ambulatory Visit (INDEPENDENT_AMBULATORY_CARE_PROVIDER_SITE_OTHER): Payer: BLUE CROSS/BLUE SHIELD | Admitting: Family Medicine

## 2016-11-18 ENCOUNTER — Encounter: Payer: Self-pay | Admitting: Family Medicine

## 2016-11-18 VITALS — BP 152/90 | HR 68 | Resp 16 | Ht 72.0 in | Wt 242.2 lb

## 2016-11-18 DIAGNOSIS — I10 Essential (primary) hypertension: Secondary | ICD-10-CM

## 2016-11-18 MED ORDER — HYDROCHLOROTHIAZIDE 12.5 MG PO TABS: 12.5000 mg | ORAL_TABLET | Freq: Every day | ORAL | 3 refills | Status: DC

## 2016-11-18 MED ORDER — METOPROLOL SUCCINATE ER 100 MG PO TB24: 100.0000 mg | ORAL_TABLET | Freq: Every day | ORAL | 6 refills | Status: DC

## 2016-11-18 MED ORDER — AMLODIPINE BESYLATE 10 MG PO TABS: ORAL_TABLET | ORAL | 6 refills | Status: DC

## 2016-11-18 NOTE — Progress Notes (Signed)
Name: Calvin Lynch   MRN: [id number]    DOB: [birth year]   Date:11/18/2016       Progress Note  Subjective  Chief Complaint  Chief Complaint  Patient presents with  . Hypertension    Ate country ham today and last visit.     Hypertension  This is a recurrent problem. The current episode started more than 1 year ago. The problem has been waxing and waning since onset. The problem is uncontrolled. Pertinent negatives include no anxiety, blurred vision, chest pain, headaches, malaise/fatigue, neck pain, orthopnea, palpitations, peripheral edema, PND, shortness of breath or sweats. There are no associated agents to hypertension. Risk factors for coronary artery disease include dyslipidemia.    No problem-specific Assessment & Plan notes found for this encounter.   Past Medical History:  Diagnosis Date  . GERD (gastroesophageal reflux disease)   . Hypertension     History reviewed. No pertinent surgical history.  Family History  Problem Relation Age of Onset  . Diabetes Mother   . Hypertension Mother     Social History   Social History  . Marital status: Married    Spouse name: N/A  . Number of children: N/A  . Years of education: N/A   Occupational History  . Not on file.   Social History Main Topics  . Smoking status: Current Every Day Smoker    Packs/day: 1.00    Types: Cigarettes  . Smokeless tobacco: Former Neurosurgeon  . Alcohol use 0.0 oz/week     Comment: pt states a 6 pack a day  . Drug use: No  . Sexual activity: Yes   Other Topics Concern  . Not on file   Social History Narrative  . No narrative on file    No Known Allergies  Outpatient Medications Prior to Visit  Medication Sig Dispense Refill  . amoxicillin-clavulanate (AUGMENTIN) 875-125 MG tablet Take 1 tablet by mouth 2 (two) times daily. 20 tablet 0  . aspirin EC 81 MG tablet Take 81 mg by mouth daily at 6 (six) AM.    . mupirocin ointment (BACTROBAN) 2 % Apply 1 application topically 2 (two)  times daily. 22 g 0  . pantoprazole (PROTONIX) 40 MG tablet Take 1 tablet (40 mg total) by mouth daily. 30 tablet 6  . amLODipine (NORVASC) 10 MG tablet TAKE 1 TABLET (10 MG TOTAL) BY MOUTH DAILY AT 6 (SIX) AM. 30 tablet 6  . metoprolol succinate (TOPROL-XL) 100 MG 24 hr tablet Take 1 tablet (100 mg total) by mouth daily. Take with or immediately following a meal. 30 tablet 6   No facility-administered medications prior to visit.     Review of Systems  Constitutional: Negative for chills, fever, malaise/fatigue and weight loss.  HENT: Negative for ear discharge, ear pain and sore throat.   Eyes: Negative for blurred vision.  Respiratory: Negative for cough, sputum production, shortness of breath and wheezing.   Cardiovascular: Negative for chest pain, palpitations, orthopnea, leg swelling and PND.  Gastrointestinal: Negative for abdominal pain, blood in stool, constipation, diarrhea, heartburn, melena and nausea.  Genitourinary: Negative for dysuria, frequency, hematuria and urgency.  Musculoskeletal: Negative for back pain, joint pain, myalgias and neck pain.  Skin: Negative for rash.  Neurological: Negative for dizziness, tingling, sensory change, focal weakness and headaches.  Endo/Heme/Allergies: Negative for environmental allergies and polydipsia. Does not bruise/bleed easily.  Psychiatric/Behavioral: Negative for depression and suicidal ideas. The patient is not nervous/anxious and does not have insomnia.  Objective  Vitals:   11/18/16 0915  BP: (!) 152/90  Pulse: 68  Resp: 16  SpO2: 98%  Weight: 242 lb 3.2 oz (109.9 kg)  Height: 6' (1.829 m)    Physical Exam  Constitutional: He is oriented to person, place, and time and well-developed, well-nourished, and in no distress.  HENT:  Head: Normocephalic.  Right Ear: External ear normal.  Left Ear: External ear normal.  Nose: Nose normal.  Mouth/Throat: Oropharynx is clear and moist.  Eyes: Pupils are equal, round,  and reactive to light. Conjunctivae and EOM are normal. Right eye exhibits no discharge. Left eye exhibits no discharge. No scleral icterus.  Neck: Normal range of motion. Neck supple. No JVD present. No tracheal deviation present. No thyromegaly present.  Cardiovascular: Normal rate, regular rhythm, normal heart sounds and intact distal pulses.  Exam reveals no gallop and no friction rub.   No murmur heard. Pulmonary/Chest: Breath sounds normal. No respiratory distress. He has no wheezes. He has no rales.  Abdominal: Soft. Bowel sounds are normal. He exhibits no mass. There is no hepatosplenomegaly. There is no tenderness. There is no rebound, no guarding and no CVA tenderness.  Musculoskeletal: Normal range of motion. He exhibits no edema or tenderness.  Lymphadenopathy:    He has no cervical adenopathy.  Neurological: He is alert and oriented to person, place, and time. He has normal sensation, normal strength and intact cranial nerves.  Skin: Skin is warm. No rash noted. No erythema.  Foot abrasion healing  Psychiatric: Mood and affect normal.  Nursing note and vitals reviewed.     Assessment & Plan  Problem List Items Addressed This Visit      Cardiovascular and Mediastinum   Essential hypertension - Primary   Relevant Medications   metoprolol succinate (TOPROL-XL) 100 MG 24 hr tablet   amLODipine (NORVASC) 10 MG tablet   hydrochlorothiazide (HYDRODIURIL) 12.5 MG tablet      Meds ordered this encounter  Medications  . metoprolol succinate (TOPROL-XL) 100 MG 24 hr tablet    Sig: Take 1 tablet (100 mg total) by mouth daily. Take with or immediately following a meal.    Dispense:  30 tablet    Refill:  6  . amLODipine (NORVASC) 10 MG tablet    Sig: TAKE 1 TABLET (10 MG TOTAL) BY MOUTH DAILY AT 6 (SIX) AM.    Dispense:  30 tablet    Refill:  6  . hydrochlorothiazide (HYDRODIURIL) 12.5 MG tablet    Sig: Take 1 tablet (12.5 mg total) by mouth daily.    Dispense:  90 tablet     Refill:  3      Dr. Elizabeth Sauer [facility]  11/18/16

## 2016-12-30 ENCOUNTER — Encounter: Payer: Self-pay | Admitting: Family Medicine

## 2016-12-30 ENCOUNTER — Ambulatory Visit: Payer: BLUE CROSS/BLUE SHIELD | Admitting: Family Medicine

## 2016-12-30 DIAGNOSIS — I10 Essential (primary) hypertension: Secondary | ICD-10-CM | POA: Diagnosis not present

## 2016-12-30 DIAGNOSIS — K219 Gastro-esophageal reflux disease without esophagitis: Secondary | ICD-10-CM

## 2016-12-30 MED ORDER — METOPROLOL SUCCINATE ER 100 MG PO TB24: 100.0000 mg | ORAL_TABLET | Freq: Every day | ORAL | 1 refills | Status: DC

## 2016-12-30 MED ORDER — AMLODIPINE BESYLATE 10 MG PO TABS: ORAL_TABLET | ORAL | 1 refills | Status: DC

## 2016-12-30 MED ORDER — PANTOPRAZOLE SODIUM 40 MG PO TBEC: 40.0000 mg | DELAYED_RELEASE_TABLET | Freq: Every day | ORAL | 1 refills | Status: DC

## 2016-12-30 MED ORDER — HYDROCHLOROTHIAZIDE 12.5 MG PO TABS: 12.5000 mg | ORAL_TABLET | Freq: Every day | ORAL | 1 refills | Status: DC

## 2016-12-30 NOTE — Progress Notes (Signed)
Name: Calvin Lynch   MRN: [id number]    DOB: [birth year]   Date:12/30/2016       Progress Note  Subjective  Chief Complaint  Chief Complaint  Patient presents with  . Hypertension    follow up after adding HCTZ    Hypertension  This is a chronic problem. The current episode started more than 1 year ago. The problem has been waxing and waning since onset. The problem is controlled. Pertinent negatives include no anxiety, blurred vision, chest pain, headaches, malaise/fatigue, neck pain, orthopnea, palpitations, peripheral edema, PND, shortness of breath or sweats. There are no associated agents to hypertension. Past treatments include calcium channel blockers, beta blockers and diuretics. The current treatment provides moderate improvement. There are no compliance problems.  There is no history of angina, kidney disease, CAD/MI, CVA, heart failure, left ventricular hypertrophy, PVD or retinopathy. There is no history of chronic renal disease, a hypertension causing med or renovascular disease.    No problem-specific Assessment & Plan notes found for this encounter.   Past Medical History:  Diagnosis Date  . GERD (gastroesophageal reflux disease)   . Hypertension     History reviewed. No pertinent surgical history.  Family History  Problem Relation Age of Onset  . Diabetes Mother   . Hypertension Mother     Social History   Socioeconomic History  . Marital status: Married    Spouse name: Not on file  . Number of children: Not on file  . Years of education: Not on file  . Highest education level: Not on file  Social Needs  . Financial resource strain: Not on file  . Food insecurity - worry: Not on file  . Food insecurity - inability: Not on file  . Transportation needs - medical: Not on file  . Transportation needs - non-medical: Not on file  Occupational History  . Not on file  Tobacco Use  . Smoking status: Current Every Day Smoker    Packs/day: 1.00    Types:  Cigarettes  . Smokeless tobacco: Former Engineer, water and Sexual Activity  . Alcohol use: Yes    Alcohol/week: 0.0 oz    Comment: pt states a 6 pack a day  . Drug use: No  . Sexual activity: Yes  Other Topics Concern  . Not on file  Social History Narrative  . Not on file    No Known Allergies  Outpatient Medications Prior to Visit  Medication Sig Dispense Refill  . aspirin EC 81 MG tablet Take 81 mg by mouth daily at 6 (six) AM.    . mupirocin ointment (BACTROBAN) 2 % Apply 1 application topically 2 (two) times daily. 22 g 0  . amLODipine (NORVASC) 10 MG tablet TAKE 1 TABLET (10 MG TOTAL) BY MOUTH DAILY AT 6 (SIX) AM. 30 tablet 6  . hydrochlorothiazide (HYDRODIURIL) 12.5 MG tablet Take 1 tablet (12.5 mg total) by mouth daily. 90 tablet 3  . metoprolol succinate (TOPROL-XL) 100 MG 24 hr tablet Take 1 tablet (100 mg total) by mouth daily. Take with or immediately following a meal. 30 tablet 6  . pantoprazole (PROTONIX) 40 MG tablet Take 1 tablet (40 mg total) by mouth daily. 30 tablet 6  . amoxicillin-clavulanate (AUGMENTIN) 875-125 MG tablet Take 1 tablet by mouth 2 (two) times daily. 20 tablet 0   No facility-administered medications prior to visit.     Review of Systems  Constitutional: Negative for chills, fever, malaise/fatigue and weight loss.  HENT: Negative for  ear discharge, ear pain and sore throat.   Eyes: Negative for blurred vision.  Respiratory: Negative for cough, sputum production, shortness of breath and wheezing.   Cardiovascular: Negative for chest pain, palpitations, orthopnea, leg swelling and PND.  Gastrointestinal: Negative for abdominal pain, blood in stool, constipation, diarrhea, heartburn, melena and nausea.  Genitourinary: Negative for dysuria, frequency, hematuria and urgency.  Musculoskeletal: Negative for back pain, joint pain, myalgias and neck pain.  Skin: Negative for rash.  Neurological: Negative for dizziness, tingling, sensory change, focal  weakness and headaches.  Endo/Heme/Allergies: Negative for environmental allergies and polydipsia. Does not bruise/bleed easily.  Psychiatric/Behavioral: Negative for depression and suicidal ideas. The patient is not nervous/anxious and does not have insomnia.      Objective  Vitals:   12/30/16 0904  BP: 140/82  Pulse: 80  Weight: 245 lb (111.1 kg)  Height: 6' (1.829 m)    Physical Exam  Constitutional: He is oriented to person, place, and time and well-developed, well-nourished, and in no distress.  HENT:  Head: Normocephalic.  Right Ear: External ear normal.  Left Ear: External ear normal.  Nose: Nose normal.  Mouth/Throat: Oropharynx is clear and moist.  Eyes: Conjunctivae and EOM are normal. Pupils are equal, round, and reactive to light. Right eye exhibits no discharge. Left eye exhibits no discharge. No scleral icterus.  Neck: Normal range of motion. Neck supple. No JVD present. No tracheal deviation present. No thyromegaly present.  Cardiovascular: Normal rate, regular rhythm, normal heart sounds and intact distal pulses. Exam reveals no gallop and no friction rub.  No murmur heard. Pulmonary/Chest: Breath sounds normal. No respiratory distress. He has no wheezes. He has no rales.  Abdominal: Soft. Bowel sounds are normal. He exhibits no mass. There is no hepatosplenomegaly. There is no tenderness. There is no rebound, no guarding and no CVA tenderness.  Musculoskeletal: Normal range of motion. He exhibits no edema or tenderness.  Lymphadenopathy:    He has no cervical adenopathy.  Neurological: He is alert and oriented to person, place, and time. He has normal sensation, normal strength and intact cranial nerves. No cranial nerve deficit.  Skin: Skin is warm. No rash noted.  Psychiatric: Mood and affect normal.  Nursing note and vitals reviewed.     Assessment & Plan  Problem List Items Addressed This Visit      Cardiovascular and Mediastinum   Essential  hypertension   Relevant Medications   amLODipine (NORVASC) 10 MG tablet   metoprolol succinate (TOPROL-XL) 100 MG 24 hr tablet   hydrochlorothiazide (HYDRODIURIL) 12.5 MG tablet     Digestive   Esophageal reflux   Relevant Medications   pantoprazole (PROTONIX) 40 MG tablet      Meds ordered this encounter  Medications  . amLODipine (NORVASC) 10 MG tablet    Sig: TAKE 1 TABLET (10 MG TOTAL) BY MOUTH DAILY AT 6 (SIX) AM.    Dispense:  90 tablet    Refill:  1  . metoprolol succinate (TOPROL-XL) 100 MG 24 hr tablet    Sig: Take 1 tablet (100 mg total) daily by mouth. Take with or immediately following a meal.    Dispense:  90 tablet    Refill:  1  . hydrochlorothiazide (HYDRODIURIL) 12.5 MG tablet    Sig: Take 1 tablet (12.5 mg total) daily by mouth.    Dispense:  90 tablet    Refill:  1  . pantoprazole (PROTONIX) 40 MG tablet    Sig: Take 1 tablet (40 mg  total) daily by mouth.    Dispense:  90 tablet    Refill:  1      Dr. [facility]  12/30/16

## 2017-02-11 ENCOUNTER — Ambulatory Visit: Payer: BLUE CROSS/BLUE SHIELD | Admitting: Family Medicine

## 2017-02-11 ENCOUNTER — Encounter: Payer: Self-pay | Admitting: Family Medicine

## 2017-02-11 VITALS — BP 140/70 | HR 80 | Ht 72.0 in | Wt 242.0 lb

## 2017-02-11 DIAGNOSIS — M722 Plantar fascial fibromatosis: Secondary | ICD-10-CM | POA: Diagnosis not present

## 2017-02-11 DIAGNOSIS — M1990 Unspecified osteoarthritis, unspecified site: Secondary | ICD-10-CM | POA: Diagnosis not present

## 2017-02-11 MED ORDER — ETODOLAC 500 MG PO TABS: 500.0000 mg | ORAL_TABLET | Freq: Two times a day (BID) | ORAL | 3 refills | Status: DC

## 2017-02-11 NOTE — Patient Instructions (Signed)

## 2017-02-11 NOTE — Progress Notes (Signed)
Name: Calvin Lynch   MRN: [id number]    DOB: 06/10/1965   Date:02/11/2017       Progress Note  Subjective  Chief Complaint  Chief Complaint  Patient presents with  . Knee Pain    R) knee  . Foot Pain    bottom of L) foot towards heel of foot    Knee Pain   The incident occurred more than 1 week ago. There was no injury mechanism. The pain is present in the right knee. The pain is at a severity of 8/10. The pain is moderate. The pain has been intermittent since onset. Pertinent negatives include no inability to bear weight, loss of motion, loss of sensation, muscle weakness, numbness or tingling. The symptoms are aggravated by movement and weight bearing. He has tried nothing for the symptoms.  Foot Injury   The incident occurred more than 1 week ago (for foot pain/2-3 weeks). There was no injury mechanism. The pain is present in the right foot. The pain is moderate. The pain has been constant since onset. Pertinent negatives include no inability to bear weight, loss of motion, loss of sensation, muscle weakness, numbness or tingling. The symptoms are aggravated by weight bearing and movement. He has tried nothing for the symptoms. The treatment provided moderate relief.    No problem-specific Assessment & Plan notes found for this encounter.   Past Medical History:  Diagnosis Date  . GERD (gastroesophageal reflux disease)   . Hypertension     No past surgical history on file.  Family History  Problem Relation Age of Onset  . Diabetes Mother   . Hypertension Mother     Social History   Socioeconomic History  . Marital status: Married    Spouse name: Not on file  . Number of children: Not on file  . Years of education: Not on file  . Highest education level: Not on file  Social Needs  . Financial resource strain: Not on file  . Food insecurity - worry: Not on file  . Food insecurity - inability: Not on file  . Transportation needs - medical: Not on file  .  Transportation needs - non-medical: Not on file  Occupational History  . Not on file  Tobacco Use  . Smoking status: Current Every Day Smoker    Packs/day: 1.00    Types: Cigarettes  . Smokeless tobacco: Former Engineer, waterUser  Substance and Sexual Activity  . Alcohol use: Yes    Alcohol/week: 0.0 oz    Comment: pt states a 6 pack a day  . Drug use: No  . Sexual activity: Yes  Other Topics Concern  . Not on file  Social History Narrative  . Not on file    No Known Allergies  Outpatient Medications Prior to Visit  Medication Sig Dispense Refill  . amLODipine (NORVASC) 10 MG tablet TAKE 1 TABLET (10 MG TOTAL) BY MOUTH DAILY AT 6 (SIX) AM. 90 tablet 1  . aspirin EC 81 MG tablet Take 81 mg by mouth daily at 6 (six) AM.    . hydrochlorothiazide (HYDRODIURIL) 12.5 MG tablet Take 1 tablet (12.5 mg total) daily by mouth. 90 tablet 1  . metoprolol succinate (TOPROL-XL) 100 MG 24 hr tablet Take 1 tablet (100 mg total) daily by mouth. Take with or immediately following a meal. 90 tablet 1  . pantoprazole (PROTONIX) 40 MG tablet Take 1 tablet (40 mg total) daily by mouth. 90 tablet 1  . mupirocin ointment (BACTROBAN) 2 %  Apply 1 application topically 2 (two) times daily. 22 g 0   No facility-administered medications prior to visit.     Review of Systems  Constitutional: Negative for chills, fever, malaise/fatigue and weight loss.  HENT: Negative for ear discharge, ear pain and sore throat.   Eyes: Negative for blurred vision.  Respiratory: Negative for cough, sputum production, shortness of breath and wheezing.   Cardiovascular: Negative for chest pain, palpitations and leg swelling.  Gastrointestinal: Negative for abdominal pain, blood in stool, constipation, diarrhea, heartburn, melena and nausea.  Genitourinary: Negative for dysuria, frequency, hematuria and urgency.  Musculoskeletal: Negative for back pain, joint pain, myalgias and neck pain.  Skin: Negative for rash.  Neurological: Negative  for dizziness, tingling, sensory change, focal weakness, numbness and headaches.  Endo/Heme/Allergies: Negative for environmental allergies and polydipsia. Does not bruise/bleed easily.  Psychiatric/Behavioral: Negative for depression and suicidal ideas. The patient is not nervous/anxious and does not have insomnia.      Objective  Vitals:   02/11/17 1047  BP: 140/70  Pulse: 80  Weight: 242 lb (109.8 kg)  Height: 6' (1.829 m)    Physical Exam  Constitutional: He is oriented to person, place, and time and well-developed, well-nourished, and in no distress.  HENT:  Head: Normocephalic.  Right Ear: External ear normal.  Left Ear: External ear normal.  Nose: Nose normal.  Mouth/Throat: Oropharynx is clear and moist.  Eyes: Conjunctivae and EOM are normal. Pupils are equal, round, and reactive to light. Right eye exhibits no discharge. Left eye exhibits no discharge. No scleral icterus.  Neck: Normal range of motion. Neck supple. No JVD present. No tracheal deviation present. No thyromegaly present.  Cardiovascular: Normal rate, regular rhythm, normal heart sounds and intact distal pulses. Exam reveals no gallop and no friction rub.  No murmur heard. Pulmonary/Chest: Breath sounds normal. No respiratory distress. He has no wheezes. He has no rales.  Abdominal: Soft. Bowel sounds are normal. He exhibits no mass. There is no hepatosplenomegaly. There is no tenderness. There is no rebound, no guarding and no CVA tenderness.  Musculoskeletal: Normal range of motion. He exhibits no edema.       Right knee: Tenderness found. Medial joint line tenderness noted. No lateral joint line, no MCL, no LCL and no patellar tendon tenderness noted.       Left foot: There is tenderness.  Tender plantar fascia.  Lymphadenopathy:    He has no cervical adenopathy.  Neurological: He is alert and oriented to person, place, and time. He has normal sensation, normal strength, normal reflexes and intact cranial  nerves. No cranial nerve deficit.  Skin: Skin is warm. No rash noted.  Psychiatric: Mood and affect normal.  Nursing note and vitals reviewed.     Assessment & Plan  Problem List Items Addressed This Visit    None    Visit Diagnoses    Arthritis    -  Primary   right knee   Relevant Medications   etodolac (LODINE) 500 MG tablet   Plantar fasciitis       Relevant Medications   etodolac (LODINE) 500 MG tablet      Meds ordered this encounter  Medications  . etodolac (LODINE) 500 MG tablet    Sig: Take 1 tablet (500 mg total) by mouth 2 (two) times daily.    Dispense:  60 tablet    Refill:  3      Dr. Elizabeth Sauereanna [facility]ber[facility]  02/11/17

## 2017-09-08 ENCOUNTER — Emergency Department: Payer: BLUE CROSS/BLUE SHIELD

## 2017-09-08 ENCOUNTER — Encounter: Payer: Self-pay | Admitting: Emergency Medicine

## 2017-09-08 ENCOUNTER — Other Ambulatory Visit: Payer: Self-pay

## 2017-09-08 ENCOUNTER — Emergency Department
Admission: EM | Admit: 2017-09-08 | Discharge: 2017-09-08 | Disposition: A | Discharge status: Home / Self Care | Payer: BLUE CROSS/BLUE SHIELD | Attending: Emergency Medicine | Admitting: Emergency Medicine

## 2017-09-08 DIAGNOSIS — Z79899 Other long term (current) drug therapy: Secondary | ICD-10-CM | POA: Diagnosis not present

## 2017-09-08 DIAGNOSIS — I10 Essential (primary) hypertension: Secondary | ICD-10-CM | POA: Insufficient documentation

## 2017-09-08 DIAGNOSIS — Z7982 Long term (current) use of aspirin: Secondary | ICD-10-CM | POA: Diagnosis not present

## 2017-09-08 DIAGNOSIS — G8929 Other chronic pain: Secondary | ICD-10-CM | POA: Diagnosis not present

## 2017-09-08 DIAGNOSIS — F17210 Nicotine dependence, cigarettes, uncomplicated: Secondary | ICD-10-CM | POA: Insufficient documentation

## 2017-09-08 DIAGNOSIS — M47816 Spondylosis without myelopathy or radiculopathy, lumbar region: Secondary | ICD-10-CM | POA: Diagnosis not present

## 2017-09-08 DIAGNOSIS — M47896 Other spondylosis, lumbar region: Secondary | ICD-10-CM | POA: Diagnosis not present

## 2017-09-08 DIAGNOSIS — M545 Low back pain: Secondary | ICD-10-CM | POA: Diagnosis not present

## 2017-09-08 MED ORDER — CYCLOBENZAPRINE HCL 10 MG PO TABS: 10.0000 mg | ORAL_TABLET | Freq: Three times a day (TID) | ORAL | 0 refills | Status: DC | PRN

## 2017-09-08 MED ORDER — MELOXICAM 15 MG PO TABS: 15.0000 mg | ORAL_TABLET | Freq: Every day | ORAL | 0 refills | Status: DC

## 2017-09-08 MED ORDER — IBUPROFEN 800 MG PO TABS
800.0000 mg | ORAL_TABLET | Freq: Once | ORAL | Status: AC
  Administered 2017-09-08: 800 mg via ORAL
  Filled 2017-09-08: qty 1

## 2017-09-08 MED ORDER — CYCLOBENZAPRINE HCL 10 MG PO TABS
10.0000 mg | ORAL_TABLET | Freq: Once | ORAL | Status: AC
  Administered 2017-09-08: 10 mg via ORAL
  Filled 2017-09-08: qty 1

## 2017-09-08 MED ORDER — TRAMADOL HCL 50 MG PO TABS
50.0000 mg | ORAL_TABLET | Freq: Once | ORAL | Status: AC
  Administered 2017-09-08: 50 mg via ORAL
  Filled 2017-09-08: qty 1

## 2017-09-08 NOTE — Discharge Instructions (Addendum)
Follow discharge care instruction take medication as directed.  Follow-up with PCP for continued care. °

## 2017-09-08 NOTE — ED Triage Notes (Signed)
Pt states right lower back pain that began yesterday, hurts worse with movement. Appears in NAD at this time, walked slow to triage.

## 2017-09-08 NOTE — ED Notes (Signed)
Already assessed by provider - see provider note

## 2017-09-08 NOTE — ED Provider Notes (Signed)
[facility] Emergency Department Provider Note   ____________________________________________   First MD Initiated Contact with Patient 09/08/17 1113     (approximate)  I have reviewed the triage vital signs and the nursing notes.   HISTORY  Chief Complaint Back Pain    HPI Calvin Lynch is a 52 y.o. male patient complain exacerbation of his chronic back pain.  Patient state pain increased yesterday with no provocative incident.  Patient does give a history of repetitive lifting and bending.  Patient denies radicular component to his back pain.  Patient has bladder bowel dysfunction.  No palliative measures for complaint.  Patient blood pressure elevated in triage.  Patient has a history of hypertension.  Past Medical History:  Diagnosis Date  . GERD (gastroesophageal reflux disease)   . Hypertension     Patient Active Problem List   Diagnosis Date Noted  . Essential hypertension 10/13/2014  . Esophageal reflux 10/13/2014    History reviewed. No pertinent surgical history.  Prior to Admission medications   Medication Sig Start Date End Date Taking? Authorizing Provider  amLODipine (NORVASC) 10 MG tablet TAKE 1 TABLET (10 MG TOTAL) BY MOUTH DAILY AT 6 (SIX) AM. 12/30/16   Duanne Limerick, MD  aspirin EC 81 MG tablet Take 81 mg by mouth daily at 6 (six) AM.    [provider]  cyclobenzaprine (FLEXERIL) 10 MG tablet Take 1 tablet (10 mg total) by mouth 3 (three) times daily as needed. 09/08/17   Joni Reining, PA-C  etodolac (LODINE) 500 MG tablet Take 1 tablet (500 mg total) by mouth 2 (two) times daily. 02/11/17   Duanne Limerick, MD  hydrochlorothiazide (HYDRODIURIL) 12.5 MG tablet Take 1 tablet (12.5 mg total) daily by mouth. 12/30/16   Duanne Limerick, MD  meloxicam (MOBIC) 15 MG tablet Take 1 tablet (15 mg total) by mouth daily. 09/08/17   Joni Reining, PA-C  metoprolol succinate (TOPROL-XL) 100 MG 24 hr tablet Take 1 tablet (100  mg total) daily by mouth. Take with or immediately following a meal. 12/30/16   Duanne Limerick, MD  pantoprazole (PROTONIX) 40 MG tablet Take 1 tablet (40 mg total) daily by mouth. 12/30/16   Duanne Limerick, MD    Allergies Patient has no known allergies.  Family History  Problem Relation Age of Onset  . Diabetes Mother   . Hypertension Mother     Social History Social History   Tobacco Use  . Smoking status: Current Every Day Smoker    Packs/day: 1.00    Types: Cigarettes  . Smokeless tobacco: Former Engineer, water Use Topics  . Alcohol use: Yes    Alcohol/week: 0.0 oz    Comment: pt states a 6 pack a day  . Drug use: No    Review of Systems Constitutional: No fever/chills Eyes: No visual changes. ENT: No sore throat. Cardiovascular: Denies chest pain. Respiratory: Denies shortness of breath. Gastrointestinal: No abdominal pain.  No nausea, no vomiting.  No diarrhea.  No constipation. Genitourinary: Negative for dysuria. Musculoskeletal: Negative for back pain. Skin: Negative for rash. Neurological: Negative for headaches, focal weakness or numbness. Endocrine:Hypertension ____________________________________________   PHYSICAL EXAM:  VITAL SIGNS: ED Triage Vitals  Enc Vitals Group     BP 09/08/17 1112 (!) 172/107     Pulse Rate 09/08/17 1112 100     Resp 09/08/17 1112 18     Temp 09/08/17 1112 98.3 F (36.8 C)  Temp Source 09/08/17 1112 Oral     SpO2 09/08/17 1112 96 %     Weight 09/08/17 1108 240 lb (108.9 kg)     Height 09/08/17 1108 6' (1.829 m)     Head Circumference --      Peak Flow --      Pain Score 09/08/17 1108 0     Pain Loc --      Pain Edu? --      Excl. in GC? --   Constitutional: Alert and oriented. Well appearing and in no acute distress. Neck: No stridor.  Cardiovascular: Normal rate, regular rhythm. Grossly normal heart sounds.  Good peripheral circulation. Respiratory: Normal respiratory effort.  No retractions. Lungs  CTAB. Gastrointestinal: Soft and nontender. No distention. No abdominal bruits. No CVA tenderness. Musculoskeletal: No obvious spinal deformity.  Mild guarding palpation spinal processes L3-L5.  Patient presents decreased range of motion with flexion only.  Negative straight leg test.  No lower extremity tenderness nor edema.  No joint effusions. Neurologic:  Normal speech and language. No gross focal neurologic deficits are appreciated. No gait instability. Skin:  Skin is warm, dry and intact. No rash noted. Psychiatric: Mood and affect are normal. Speech and behavior are normal.  ____________________________________________   LABS (all labs ordered are listed, but only abnormal results are displayed)  Labs Reviewed - No data to display ____________________________________________  EKG   ____________________________________________  RADIOLOGY  ED MD interpretation:    Official radiology report(s): Dg Lumbar Spine Complete  Result Date: 09/08/2017 CLINICAL DATA:  Right side low back pain since yesterday. No known injury. EXAM: LUMBAR SPINE - COMPLETE 4+ VIEW COMPARISON:  CT abdomen and pelvis 10/13/2014. FINDINGS: Vertebral body height and alignment are normal. Intervertebral disc space height is maintained with mild scattered endplate spurring noted. Facet joints are unremarkable. No pars interarticularis defect. Atherosclerosis noted. IMPRESSION: No acute or focal abnormality. Mild lumbar spondylosis. Atherosclerosis. Electronically Signed   By: Drusilla Kannerhomas  Dalessio M.D.   On: 09/08/2017 11:51    ____________________________________________   PROCEDURES  Procedure(s) performed: None  Procedures  Critical Care performed: No  ____________________________________________   INITIAL IMPRESSION / ASSESSMENT AND PLAN / ED COURSE  As part of my medical decision making, I reviewed the following data within the electronic MEDICAL RECORD NUMBER    Low back pain secondary to  osteoarthritis.  Discussed x-ray findings with patient.  Patient given discharge care instruction advised take medication as directed.  Patient advised follow-up PCP for continued care.      ____________________________________________   FINAL CLINICAL IMPRESSION(S) / ED DIAGNOSES  Final diagnoses:  Osteoarthritis of facet joint of lumbar spine     ED Discharge Orders        Ordered    meloxicam (MOBIC) 15 MG tablet  Daily     09/08/17 1159    cyclobenzaprine (FLEXERIL) 10 MG tablet  3 times daily PRN     09/08/17 1159       Note:  This document was prepared using Dragon voice recognition software and may include unintentional dictation errors.    Joni ReiningSmith, Libbi Towner K, PA-C 09/08/17 1208    Jene EveryKinner, Robert, MD 09/08/17 [phone]

## 2017-09-15 ENCOUNTER — Other Ambulatory Visit: Payer: Self-pay | Admitting: Family Medicine

## 2017-09-15 DIAGNOSIS — I10 Essential (primary) hypertension: Secondary | ICD-10-CM

## 2017-10-22 ENCOUNTER — Other Ambulatory Visit: Payer: Self-pay | Admitting: Family Medicine

## 2017-10-22 DIAGNOSIS — I10 Essential (primary) hypertension: Secondary | ICD-10-CM

## 2017-12-05 ENCOUNTER — Other Ambulatory Visit: Payer: Self-pay | Admitting: Family Medicine

## 2017-12-05 DIAGNOSIS — I10 Essential (primary) hypertension: Secondary | ICD-10-CM

## 2017-12-24 ENCOUNTER — Other Ambulatory Visit: Payer: Self-pay

## 2017-12-24 DIAGNOSIS — I10 Essential (primary) hypertension: Secondary | ICD-10-CM

## 2017-12-24 MED ORDER — AMLODIPINE BESYLATE 10 MG PO TABS: ORAL_TABLET | ORAL | 0 refills | Status: DC

## 2017-12-29 ENCOUNTER — Ambulatory Visit (INDEPENDENT_AMBULATORY_CARE_PROVIDER_SITE_OTHER): Payer: BLUE CROSS/BLUE SHIELD | Admitting: Family Medicine

## 2017-12-29 ENCOUNTER — Encounter: Payer: Self-pay | Admitting: Family Medicine

## 2017-12-29 VITALS — BP 138/88 | HR 76 | Ht 72.0 in | Wt 240.0 lb

## 2017-12-29 DIAGNOSIS — I10 Essential (primary) hypertension: Secondary | ICD-10-CM

## 2017-12-29 DIAGNOSIS — K219 Gastro-esophageal reflux disease without esophagitis: Secondary | ICD-10-CM | POA: Diagnosis not present

## 2017-12-29 DIAGNOSIS — Z1211 Encounter for screening for malignant neoplasm of colon: Secondary | ICD-10-CM

## 2017-12-29 DIAGNOSIS — F17200 Nicotine dependence, unspecified, uncomplicated: Secondary | ICD-10-CM | POA: Diagnosis not present

## 2017-12-29 MED ORDER — PANTOPRAZOLE SODIUM 40 MG PO TBEC: 40.0000 mg | DELAYED_RELEASE_TABLET | Freq: Every day | ORAL | 1 refills | Status: DC

## 2017-12-29 MED ORDER — AMLODIPINE BESYLATE 10 MG PO TABS: ORAL_TABLET | ORAL | 0 refills | Status: DC

## 2017-12-29 MED ORDER — AMLODIPINE BESYLATE 10 MG PO TABS: ORAL_TABLET | ORAL | 5 refills | Status: DC

## 2017-12-29 MED ORDER — METOPROLOL SUCCINATE ER 100 MG PO TB24: 100.0000 mg | ORAL_TABLET | Freq: Every day | ORAL | 1 refills | Status: DC

## 2017-12-29 MED ORDER — HYDROCHLOROTHIAZIDE 12.5 MG PO TABS: 12.5000 mg | ORAL_TABLET | Freq: Every day | ORAL | 1 refills | Status: DC

## 2017-12-29 NOTE — Addendum Note (Signed)
Addended by: Everitt AmberLYNCH, Alex Mcmanigal L on: 12/29/2017 01:18 PM   Modules accepted: Orders

## 2017-12-29 NOTE — Progress Notes (Signed)
Date:  12/29/2017   Name:  Calvin Lynch   DOB:  12/18/1965   MRN:  [id number]   Chief Complaint: Hypertension; Gastroesophageal Reflux; and Colon Cancer Screening (wants referral to have colonoscopy) Hypertension  This is a chronic problem. The current episode started more than 1 year ago. The problem is unchanged. The problem is controlled. Pertinent negatives include no anxiety, blurred vision, chest pain, headaches, malaise/fatigue, neck pain, orthopnea, palpitations, peripheral edema, PND, shortness of breath or sweats. There are no associated agents to hypertension. There are no known risk factors for coronary artery disease. Past treatments include beta blockers and calcium channel blockers. The current treatment provides moderate improvement. There are no compliance problems.  There is no history of angina, kidney disease, CAD/MI, CVA, heart failure, left ventricular hypertrophy, PVD or retinopathy. There is no history of chronic renal disease, a hypertension causing med or renovascular disease.  Gastroesophageal Reflux  He reports no abdominal pain, no belching, no chest pain, no choking, no coughing, no dysphagia, no early satiety, no globus sensation, no heartburn, no hoarse voice, no nausea, no sore throat, no stridor, no tooth decay, no water brash or no wheezing. This is a chronic problem. The current episode started more than 1 year ago. The problem occurs occasionally. The problem has been waxing and waning. The symptoms are aggravated by certain foods. Pertinent negatives include no anemia, fatigue, melena, muscle weakness, orthopnea or weight loss. He has tried a PPI for the symptoms. The treatment provided moderate relief.     Review of Systems  Constitutional: Negative for appetite change, chills, fatigue, fever, malaise/fatigue, unexpected weight change and weight loss.  HENT: Negative for ear pain, facial swelling, hearing loss, hoarse voice, nosebleeds, sneezing, sore  throat and trouble swallowing.   Eyes: Negative for blurred vision, photophobia, pain, discharge, redness, itching and visual disturbance.  Respiratory: Negative for cough, choking, chest tightness, shortness of breath and wheezing.   Cardiovascular: Negative for chest pain, palpitations, orthopnea, leg swelling and PND.  Gastrointestinal: Negative for abdominal pain, blood in stool, constipation, diarrhea, dysphagia, heartburn, melena, nausea, rectal pain and vomiting.  Endocrine: Negative for cold intolerance, heat intolerance, polydipsia, polyphagia and polyuria.  Genitourinary: Negative for decreased urine volume, difficulty urinating, discharge, dysuria, flank pain, frequency, hematuria, penile pain, penile swelling, scrotal swelling, testicular pain and urgency.  Musculoskeletal: Negative for back pain, joint swelling, muscle weakness, neck pain and neck stiffness.  Skin: Negative for color change and rash.  Allergic/Immunologic: Negative for immunocompromised state.  Neurological: Negative for dizziness, tremors, seizures, syncope, speech difficulty, weakness, light-headedness, numbness and headaches.  Hematological: Does not bruise/bleed easily.  Psychiatric/Behavioral: Negative for agitation, behavioral problems, confusion, dysphoric mood, hallucinations, self-injury and suicidal ideas. The patient is not nervous/anxious.     Patient Active Problem List   Diagnosis Date Noted  . Essential hypertension 10/13/2014  . Esophageal reflux 10/13/2014    No Known Allergies  No past surgical history on file.  Social History   Tobacco Use  . Smoking status: Current Every Day Smoker    Packs/day: 1.00    Types: Cigarettes  . Smokeless tobacco: Former Engineer, waterUser  Substance Use Topics  . Alcohol use: Yes    Alcohol/week: 0.0 standard drinks    Comment: pt states a 6 pack a day  . Drug use: No     Medication list has been reviewed and updated.  Current Meds  Medication Sig  .  amLODipine (NORVASC) 10 MG tablet TAKE 1 TABLET (10 MG  TOTAL) BY MOUTH DAILY AT 6 (SIX) AM.  . aspirin EC 81 MG tablet Take 81 mg by mouth daily at 6 (six) AM.  . hydrochlorothiazide (HYDRODIURIL) 12.5 MG tablet Take 1 tablet (12.5 mg total) daily by mouth.  . metoprolol succinate (TOPROL-XL) 100 MG 24 hr tablet Take 1 tablet (100 mg total) daily by mouth. Take with or immediately following a meal.  . pantoprazole (PROTONIX) 40 MG tablet Take 1 tablet (40 mg total) daily by mouth.    PHQ 2/9 Scores 12/29/2017 11/04/2016  PHQ - 2 Score 0 0  PHQ- 9 Score 0 1    Physical Exam  Constitutional: He is oriented to person, place, and time. He appears well-developed and well-nourished.  HENT:  Head: Normocephalic.  Right Ear: External ear normal.  Left Ear: External ear normal.  Nose: Nose normal.  Mouth/Throat: Oropharynx is clear and moist.  Eyes: Pupils are equal, round, and reactive to light. Conjunctivae and EOM are normal. Right eye exhibits no discharge. Left eye exhibits no discharge. No scleral icterus.  Neck: Normal range of motion. Neck supple. No JVD present. No tracheal deviation present. No thyromegaly present.  Cardiovascular: Normal rate, regular rhythm, normal heart sounds and intact distal pulses. Exam reveals no gallop and no friction rub.  No murmur heard. Pulmonary/Chest: Breath sounds normal. No respiratory distress. He has no wheezes. He has no rales.  Abdominal: Soft. Bowel sounds are normal. He exhibits no mass. There is no hepatosplenomegaly. There is no tenderness. There is no rebound, no guarding and no CVA tenderness.  Genitourinary: Rectum normal and prostate normal. Rectal exam shows guaiac negative stool.  Musculoskeletal: Normal range of motion. He exhibits no edema or tenderness.  Lymphadenopathy:    He has no cervical adenopathy.  Neurological: He is alert and oriented to person, place, and time. He has normal strength and normal reflexes. No cranial nerve  deficit.  Skin: Skin is warm. No rash noted.  Nursing note and vitals reviewed.   BP 138/88   Pulse 76   Ht 6' (1.829 m)   Wt 240 lb (108.9 kg)   BMI 32.55 kg/m   Assessment and Plan:  1. Essential hypertension Chronic Controlled. Continue HCTZ 12.5, metoprolol 100 mg daily, and amlodipine 10 mg daily.  - hydrochlorothiazide (HYDRODIURIL) 12.5 MG tablet; Take 1 tablet (12.5 mg total) by mouth daily.  Dispense: 90 tablet; Refill: 1 - metoprolol succinate (TOPROL-XL) 100 MG 24 hr tablet; Take 1 tablet (100 mg total) by mouth daily. Take with or immediately following a meal.  Dispense: 90 tablet; Refill: 1 - amLODipine (NORVASC) 10 MG tablet; TAKE 1 TABLET (10 MG TOTAL) BY MOUTH DAILY AT 6 (SIX) AM.  Dispense: 7 tablet; Refill: 0  2. Gastroesophageal reflux disease, esophagitis presence not specified  Chronic Stable Controlled. Continue pantoprazole 40 mg daily  - pantoprazole (PROTONIX) 40 MG tablet; Take 1 tablet (40 mg total) by mouth daily.  Dispense: 90 tablet; Refill: 1  3. Tobacco dependence Patient has been advised of the health risks of smoking and counseled concerning cessation of tobacco products. I spent over 3 minutes for discussion and to answer questions.  4. Colon cancer screening Discussed and referral to [facility].Guaiac negative. - Ambulatory referral to Gastroenterology     Dr. Elizabeth Sauer [facility]  12/29/2017

## 2018-01-01 ENCOUNTER — Other Ambulatory Visit: Payer: Self-pay

## 2018-01-05 ENCOUNTER — Other Ambulatory Visit: Payer: Self-pay

## 2018-01-05 DIAGNOSIS — I10 Essential (primary) hypertension: Secondary | ICD-10-CM

## 2018-01-05 MED ORDER — METOPROLOL SUCCINATE ER 100 MG PO TB24: 100.0000 mg | ORAL_TABLET | Freq: Every day | ORAL | 1 refills | Status: DC

## 2018-01-22 DIAGNOSIS — Z01818 Encounter for other preprocedural examination: Secondary | ICD-10-CM | POA: Diagnosis not present

## 2018-01-22 DIAGNOSIS — Z1211 Encounter for screening for malignant neoplasm of colon: Secondary | ICD-10-CM | POA: Diagnosis not present

## 2018-03-10 DIAGNOSIS — Z1211 Encounter for screening for malignant neoplasm of colon: Secondary | ICD-10-CM | POA: Diagnosis not present

## 2018-03-10 DIAGNOSIS — K621 Rectal polyp: Secondary | ICD-10-CM | POA: Diagnosis not present

## 2018-03-10 DIAGNOSIS — D125 Benign neoplasm of sigmoid colon: Secondary | ICD-10-CM | POA: Diagnosis not present

## 2018-03-10 DIAGNOSIS — D128 Benign neoplasm of rectum: Secondary | ICD-10-CM | POA: Diagnosis not present

## 2018-03-10 DIAGNOSIS — K5140 Inflammatory polyps of colon without complications: Secondary | ICD-10-CM | POA: Diagnosis not present

## 2018-03-10 DIAGNOSIS — K5730 Diverticulosis of large intestine without perforation or abscess without bleeding: Secondary | ICD-10-CM | POA: Diagnosis not present

## 2018-04-15 ENCOUNTER — Encounter: Payer: Self-pay | Admitting: Family Medicine

## 2018-04-15 ENCOUNTER — Ambulatory Visit: Payer: BLUE CROSS/BLUE SHIELD | Admitting: Family Medicine

## 2018-04-15 VITALS — BP 142/82 | HR 80 | Ht 72.0 in | Wt 239.0 lb

## 2018-04-15 DIAGNOSIS — S39012A Strain of muscle, fascia and tendon of lower back, initial encounter: Secondary | ICD-10-CM | POA: Diagnosis not present

## 2018-04-15 DIAGNOSIS — G5601 Carpal tunnel syndrome, right upper limb: Secondary | ICD-10-CM

## 2018-04-15 MED ORDER — CYCLOBENZAPRINE HCL 5 MG PO TABS: 5.0000 mg | ORAL_TABLET | Freq: Three times a day (TID) | ORAL | 1 refills | Status: DC | PRN

## 2018-04-15 MED ORDER — PREDNISONE 10 MG PO TABS: 10.0000 mg | ORAL_TABLET | Freq: Every day | ORAL | 0 refills | Status: DC

## 2018-04-15 MED ORDER — MELOXICAM 15 MG PO TABS: 15.0000 mg | ORAL_TABLET | Freq: Every day | ORAL | 0 refills | Status: DC

## 2018-04-15 NOTE — Progress Notes (Signed)
Date:  04/15/2018   Name:  Calvin Lynch   DOB:  1966/01/06   MRN:  [id number]   Chief Complaint: Back Pain (lower back pain on l) side- since back pain, R) hand and foot has been numb)  Back Pain  This is a new problem. The current episode started in the past 7 days (saturday night). The problem occurs constantly. The problem is unchanged. The pain is present in the lumbar spine. The quality of the pain is described as aching. The pain does not radiate. The pain is at a severity of 7/10. The pain is moderate. The symptoms are aggravated by bending and twisting. Associated symptoms include numbness. Pertinent negatives include no abdominal pain, bladder incontinence, bowel incontinence, chest pain, dysuria, fever, headaches, leg pain, paresis, paresthesias, perianal numbness, tingling, weakness or weight loss. (Right hand/foot) Risk factors include recent trauma. Treatments tried: tylenol. The treatment provided moderate relief.    Review of Systems  Constitutional: Negative for chills, fever and weight loss.  HENT: Negative for drooling, ear discharge, ear pain and sore throat.   Respiratory: Negative for cough, shortness of breath and wheezing.   Cardiovascular: Negative for chest pain, palpitations and leg swelling.  Gastrointestinal: Negative for abdominal pain, blood in stool, bowel incontinence, constipation, diarrhea and nausea.  Endocrine: Negative for polydipsia.  Genitourinary: Negative for bladder incontinence, dysuria, frequency, hematuria and urgency.  Musculoskeletal: Positive for back pain. Negative for myalgias and neck pain.  Skin: Negative for rash.  Allergic/Immunologic: Negative for environmental allergies.  Neurological: Positive for numbness. Negative for dizziness, tingling, weakness, headaches and paresthesias.  Hematological: Does not bruise/bleed easily.  Psychiatric/Behavioral: Negative for suicidal ideas. The patient is not nervous/anxious.     Patient  Active Problem List   Diagnosis Date Noted  . Essential hypertension 10/13/2014  . Esophageal reflux 10/13/2014    No Known Allergies  No past surgical history on file.  Social History   Tobacco Use  . Smoking status: Current Every Day Smoker    Packs/day: 1.00    Types: Cigarettes  . Smokeless tobacco: Former Engineer, water Use Topics  . Alcohol use: Yes    Alcohol/week: 0.0 standard drinks    Comment: pt states a 6 pack a day  . Drug use: No     Medication list has been reviewed and updated.  Current Meds  Medication Sig  . amLODipine (NORVASC) 10 MG tablet TAKE 1 TABLET (10 MG TOTAL) BY MOUTH DAILY AT 6 (SIX) AM.  . aspirin EC 81 MG tablet Take 81 mg by mouth daily at 6 (six) AM.  . hydrochlorothiazide (HYDRODIURIL) 12.5 MG tablet Take 1 tablet (12.5 mg total) by mouth daily.  . metoprolol succinate (TOPROL-XL) 100 MG 24 hr tablet Take 1 tablet (100 mg total) by mouth daily. Take with or immediately following a meal.  . pantoprazole (PROTONIX) 40 MG tablet Take 1 tablet (40 mg total) by mouth daily.    PHQ 2/9 Scores 12/29/2017 11/04/2016  PHQ - 2 Score 0 0  PHQ- 9 Score 0 1    Physical Exam Vitals signs and nursing note reviewed.  HENT:     Head: Normocephalic.     Right Ear: External ear normal.     Left Ear: External ear normal.     Nose: Nose normal.  Eyes:     General: No scleral icterus.       Right eye: No discharge.        Left eye: No discharge.  Conjunctiva/sclera: Conjunctivae normal.     Pupils: Pupils are equal, round, and reactive to light.  Neck:     Musculoskeletal: Normal range of motion and neck supple.     Thyroid: No thyromegaly.     Vascular: No JVD.     Trachea: No tracheal deviation.  Cardiovascular:     Rate and Rhythm: Normal rate and regular rhythm.     Heart sounds: Normal heart sounds. No murmur. No friction rub. No gallop.   Pulmonary:     Effort: No respiratory distress.     Breath sounds: Normal breath sounds. No  wheezing or rales.  Abdominal:     General: Bowel sounds are normal.     Palpations: Abdomen is soft. There is no mass.     Tenderness: There is no abdominal tenderness. There is no guarding or rebound.  Musculoskeletal: Normal range of motion.        General: No tenderness.     Right wrist: He exhibits normal range of motion, no tenderness, no bony tenderness and no swelling.     Left wrist: He exhibits normal range of motion, no tenderness, no bony tenderness and no swelling.     Lumbar back: He exhibits spasm. He exhibits normal range of motion, no tenderness, no bony tenderness, no swelling and no deformity.     Comments: positivr phelen/Tinel right wrist  Lymphadenopathy:     Cervical: No cervical adenopathy.  Skin:    General: Skin is warm.     Findings: No rash.  Neurological:     Mental Status: He is alert and oriented to person, place, and time.     Cranial Nerves: No cranial nerve deficit.     Deep Tendon Reflexes: Reflexes are normal and symmetric.     BP (!) 142/82   Pulse 80   Ht 6' (1.829 m)   Wt 239 lb (108.4 kg)   BMI 32.41 kg/m   Assessment and Plan: 1. Lumbosacral strain, initial encounter [facility].  Acute.  Not associated with injury.  Distant.  Meloxicam 15 mg 1 a day.  Prednisone taper 40 mg over 5 days.  And cyclobenzaprine for muscle relaxation for spasm. - meloxicam (MOBIC) 15 MG tablet; Take 1 tablet (15 mg total) by mouth daily.  Dispense: 30 tablet; Refill: 0 - predniSONE (DELTASONE) 10 MG tablet; Take 1 tablet (10 mg total) by mouth daily with breakfast.  Dispense: 30 tablet; Refill: 0 - cyclobenzaprine (FLEXERIL) 5 MG tablet; Take 1 tablet (5 mg total) by mouth 3 (three) times daily as needed for muscle spasms.  Dispense: 30 tablet; Refill: 1  2. Carpal tunnel syndrome of right wrist Patient has history just developed carpal tunnel of the right wrist.  Patient has a positive Phalen Tinel sign of the right hand.  Will begin with a prednisone taper and  if unresolved next step will be referral to the [facility]. - predniSONE (DELTASONE) 10 MG tablet; Take 1 tablet (10 mg total) by mouth daily with breakfast.  Dispense: 30 tablet; Refill: 0 3.hypertension patient was reinforced on sodium intake at a minimal level.

## 2018-04-15 NOTE — Patient Instructions (Signed)
Acute Back Pain, Adult  Acute back pain is sudden and usually short-lived. It is often caused by an injury to the muscles and tissues in the back. The injury may result from:   A muscle or ligament getting overstretched or torn (strained). Ligaments are tissues that connect bones to each other. Lifting something improperly can cause a back strain.   Wear and tear (degeneration) of the spinal disks. Spinal disks are circular tissue that provides cushioning between the bones of the spine (vertebrae).   Twisting motions, such as while playing sports or doing yard work.   A hit to the back.   Arthritis.  You may have a physical exam, lab tests, and imaging tests to find the cause of your pain. Acute back pain usually goes away with rest and home care.  Follow these instructions at home:  Managing pain, stiffness, and swelling   Take over-the-counter and prescription medicines only as told by your health care provider.   Your health care provider may recommend applying ice during the first 24-48 hours after your pain starts. To do this:  ? Put ice in a plastic bag.  ? Place a towel between your skin and the bag.  ? Leave the ice on for 20 minutes, 2-3 times a day.   If directed, apply heat to the affected area as often as told by your health care provider. Use the heat source that your health care provider recommends, such as a moist heat pack or a heating pad.  ? Place a towel between your skin and the heat source.  ? Leave the heat on for 20-30 minutes.  ? Remove the heat if your skin turns bright red. This is especially important if you are unable to feel pain, heat, or cold. You have a greater risk of getting burned.  Activity     Do not stay in bed. Staying in bed for more than 1-2 days can delay your recovery.   Sit up and stand up straight. Avoid leaning forward when you sit, or hunching over when you stand.  ? If you work at a desk, sit close to it so you do not need to lean over. Keep your chin tucked  in. Keep your neck drawn back, and keep your elbows bent at a right angle. Your arms should look like the letter "L."  ? Sit high and close to the steering wheel when you drive. Add lower back (lumbar) support to your car seat, if needed.   Take short walks on even surfaces as soon as you are able. Try to increase the length of time you walk each day.   Do not sit, drive, or stand in one place for more than 30 minutes at a time. Sitting or standing for long periods of time can put stress on your back.   Do not drive or use heavy machinery while taking prescription pain medicine.   Use proper lifting techniques. When you bend and lift, use positions that put less stress on your back:  ? Bend your knees.  ? Keep the load close to your body.  ? Avoid twisting.   Exercise regularly as told by your health care provider. Exercising helps your back heal faster and helps prevent back injuries by keeping muscles strong and flexible.   Work with a physical therapist to make a safe exercise program, as recommended by your health care provider. Do any exercises as told by your physical therapist.  Lifestyle   Maintain   a healthy weight. Extra weight puts stress on your back and makes it difficult to have good posture.   Avoid activities or situations that make you feel anxious or stressed. Stress and anxiety increase muscle tension and can make back pain worse. Learn ways to manage anxiety and stress, such as through exercise.  General instructions   Sleep on a firm mattress in a comfortable position. Try lying on your side with your knees slightly bent. If you lie on your back, put a pillow under your knees.   Follow your treatment plan as told by your health care provider. This may include:  ? Cognitive or behavioral therapy.  ? Acupuncture or massage therapy.  ? Meditation or yoga.  Contact a health care provider if:   You have pain that is not relieved with rest or medicine.   You have increasing pain going down  into your legs or buttocks.   Your pain does not improve after 2 weeks.   You have pain at night.   You lose weight without trying.   You have a fever or chills.  Get help right away if:   You develop new bowel or bladder control problems.   You have unusual weakness or numbness in your arms or legs.   You develop nausea or vomiting.   You develop abdominal pain.   You feel faint.  Summary   Acute back pain is sudden and usually short-lived.   Use proper lifting techniques. When you bend and lift, use positions that put less stress on your back.   Take over-the-counter and prescription medicines and apply heat or ice as directed by your health care provider.  This information is not intended to replace advice given to you by your health care provider. Make sure you discuss any questions you have with your health care provider.  Document Released: 02/03/2005 Document Revised: 09/10/2017 Document Reviewed: 09/17/2016  [employer]vier Interactive Patient Education  2019 [employer]

## 2018-06-29 ENCOUNTER — Ambulatory Visit: Payer: BLUE CROSS/BLUE SHIELD | Admitting: Family Medicine

## 2018-07-09 ENCOUNTER — Other Ambulatory Visit: Payer: Self-pay

## 2018-07-09 ENCOUNTER — Ambulatory Visit: Payer: BLUE CROSS/BLUE SHIELD | Admitting: Family Medicine

## 2018-07-09 ENCOUNTER — Encounter: Payer: Self-pay | Admitting: Family Medicine

## 2018-07-09 VITALS — BP 150/100 | HR 100 | Ht 72.0 in | Wt 233.0 lb

## 2018-07-09 DIAGNOSIS — E6609 Other obesity due to excess calories: Secondary | ICD-10-CM

## 2018-07-09 DIAGNOSIS — I10 Essential (primary) hypertension: Secondary | ICD-10-CM

## 2018-07-09 DIAGNOSIS — Z9119 Patient's noncompliance with other medical treatment and regimen: Secondary | ICD-10-CM

## 2018-07-09 DIAGNOSIS — F17210 Nicotine dependence, cigarettes, uncomplicated: Secondary | ICD-10-CM

## 2018-07-09 DIAGNOSIS — F1010 Alcohol abuse, uncomplicated: Secondary | ICD-10-CM

## 2018-07-09 DIAGNOSIS — Z6832 Body mass index (BMI) 32.0-32.9, adult: Secondary | ICD-10-CM

## 2018-07-09 DIAGNOSIS — K219 Gastro-esophageal reflux disease without esophagitis: Secondary | ICD-10-CM | POA: Diagnosis not present

## 2018-07-09 DIAGNOSIS — Z91199 Patient's noncompliance with other medical treatment and regimen due to unspecified reason: Secondary | ICD-10-CM

## 2018-07-09 MED ORDER — PANTOPRAZOLE SODIUM 40 MG PO TBEC: 40.0000 mg | DELAYED_RELEASE_TABLET | Freq: Every day | ORAL | 1 refills | Status: DC

## 2018-07-09 MED ORDER — AMLODIPINE BESYLATE 10 MG PO TABS: ORAL_TABLET | ORAL | 1 refills | Status: DC

## 2018-07-09 MED ORDER — HYDROCHLOROTHIAZIDE 12.5 MG PO TABS: 12.5000 mg | ORAL_TABLET | Freq: Every day | ORAL | 1 refills | Status: DC

## 2018-07-09 MED ORDER — METOPROLOL SUCCINATE ER 100 MG PO TB24: 100.0000 mg | ORAL_TABLET | Freq: Every day | ORAL | 1 refills | Status: DC

## 2018-07-09 NOTE — Progress Notes (Signed)
Date:  07/09/2018   Name:  Calvin Lynch   DOB:  04/22/1965   MRN:  [id number]   Chief Complaint: Gastroesophageal Reflux and Hypertension  Gastroesophageal Reflux  He reports no abdominal pain, no belching, no chest pain, no choking, no coughing, no dysphagia, no early satiety, no globus sensation, no heartburn, no hoarse voice, no nausea, no sore throat, no stridor, no tooth decay, no water brash or no wheezing. This is a chronic problem. The current episode started more than 1 year ago. The problem has been gradually improving. The symptoms are aggravated by certain foods. Pertinent negatives include no anemia, fatigue, melena, muscle weakness or orthopnea. Risk factors include smoking/tobacco exposure and obesity. He has tried a PPI for the symptoms.  Hypertension  This is a chronic problem. The current episode started more than 1 year ago. The problem is controlled. Pertinent negatives include no anxiety, blurred vision, chest pain, headaches, malaise/fatigue, neck pain, orthopnea, palpitations, peripheral edema, PND, shortness of breath or sweats. There are no associated agents to hypertension. Risk factors for coronary artery disease include diabetes mellitus. Past treatments include diuretics, calcium channel blockers and ACE inhibitors. The current treatment provides moderate improvement. There are no compliance problems.  There is no history of angina, kidney disease, CAD/MI, CVA, heart failure, left ventricular hypertrophy, PVD or retinopathy. There is no history of chronic renal disease, a hypertension causing med or renovascular disease.    Review of Systems  Constitutional: Negative for chills, fatigue, fever and malaise/fatigue.  HENT: Negative for drooling, ear discharge, ear pain, hoarse voice and sore throat.   Eyes: Negative for blurred vision.  Respiratory: Negative for cough, choking, shortness of breath and wheezing.   Cardiovascular: Negative for chest pain,  palpitations, orthopnea, leg swelling and PND.  Gastrointestinal: Negative for abdominal pain, blood in stool, constipation, diarrhea, dysphagia, heartburn, melena and nausea.  Endocrine: Negative for polydipsia.  Genitourinary: Negative for dysuria, frequency, hematuria and urgency.  Musculoskeletal: Negative for back pain, myalgias, muscle weakness and neck pain.  Skin: Negative for rash.  Allergic/Immunologic: Negative for environmental allergies.  Neurological: Negative for dizziness and headaches.  Hematological: Does not bruise/bleed easily.  Psychiatric/Behavioral: Negative for suicidal ideas. The patient is not nervous/anxious.     Patient Active Problem List   Diagnosis Date Noted   Essential hypertension 10/13/2014   Esophageal reflux 10/13/2014    No Known Allergies  No past surgical history on file.  Social History   Tobacco Use   Smoking status: Current Every Day Smoker    Packs/day: 1.00    Types: Cigarettes   Smokeless tobacco: Former NeurosurgeonUser  Substance Use Topics   Alcohol use: Yes    Alcohol/week: 0.0 standard drinks    Comment: pt states a 6 pack a day   Drug use: No     Medication list has been reviewed and updated.  Current Meds  Medication Sig   amLODipine (NORVASC) 10 MG tablet TAKE 1 TABLET (10 MG TOTAL) BY MOUTH DAILY AT 6 (SIX) AM.   aspirin EC 81 MG tablet Take 81 mg by mouth daily at 6 (six) AM.   hydrochlorothiazide (HYDRODIURIL) 12.5 MG tablet Take 1 tablet (12.5 mg total) by mouth daily.   metoprolol succinate (TOPROL-XL) 100 MG 24 hr tablet Take 1 tablet (100 mg total) by mouth daily. Take with or immediately following a meal.   pantoprazole (PROTONIX) 40 MG tablet Take 1 tablet (40 mg total) by mouth daily.   [DISCONTINUED] cyclobenzaprine (FLEXERIL) 5  MG tablet Take 1 tablet (5 mg total) by mouth 3 (three) times daily as needed for muscle spasms.    PHQ 2/9 Scores 07/09/2018 12/29/2017 11/04/2016  PHQ - 2 Score 0 0 0  PHQ- 9  Score 0 0 1    BP Readings from Last 3 Encounters:  07/09/18 (!) 150/100  04/15/18 (!) 142/82  12/29/17 138/88    Physical Exam Vitals signs and nursing note reviewed.  HENT:     Head: Normocephalic.     Right Ear: Tympanic membrane, ear canal and external ear normal.     Left Ear: Tympanic membrane, ear canal and external ear normal.     Nose: Nose normal. No congestion or rhinorrhea.     Mouth/Throat:     Mouth: Mucous membranes are moist.  Eyes:     General: No scleral icterus.       Right eye: No discharge.        Left eye: No discharge.     Conjunctiva/sclera: Conjunctivae normal.     Pupils: Pupils are equal, round, and reactive to light.  Neck:     Musculoskeletal: Normal range of motion and neck supple.     Thyroid: No thyromegaly.     Vascular: No JVD.     Trachea: No tracheal deviation.  Cardiovascular:     Rate and Rhythm: Normal rate and regular rhythm.     Heart sounds: Normal heart sounds. No murmur. No friction rub. No gallop.   Pulmonary:     Effort: Pulmonary effort is normal. No respiratory distress.     Breath sounds: Normal breath sounds. No wheezing or rales.  Abdominal:     General: Abdomen is flat. Bowel sounds are normal.     Palpations: Abdomen is soft. There is no mass.     Tenderness: There is no abdominal tenderness. There is no guarding or rebound.  Musculoskeletal: Normal range of motion.        General: No tenderness.  Lymphadenopathy:     Cervical: No cervical adenopathy.  Skin:    General: Skin is warm.     Findings: No rash.  Neurological:     Mental Status: He is alert and oriented to person, place, and time.     Cranial Nerves: No cranial nerve deficit.     Deep Tendon Reflexes: Reflexes are normal and symmetric.     Wt Readings from Last 3 Encounters:  07/09/18 233 lb (105.7 kg)  04/15/18 239 lb (108.4 kg)  12/29/17 240 lb (108.9 kg)    BP (!) 150/100    Pulse 100    Ht 6' (1.829 m)    Wt 233 lb (105.7 kg)    BMI 31.60  kg/m   Assessment and Plan:  1. Essential hypertension Chronic.  Uncontrolled.  Patient has not been taking medication due to alcohol binge drinking.  Refill amlodipine 10 mg once a day, hydrochlorothiazide 12.5 once a day, and metoprolol XL 100 mg once a day will check a renal function panel. - Renal Function Panel - Lipid Panel With LDL/HDL Ratio - amLODipine (NORVASC) 10 MG tablet; TAKE 1 TABLET (10 MG TOTAL) BY MOUTH DAILY AT 6 (SIX) AM.  Dispense: 90 tablet; Refill: 1 - hydrochlorothiazide (HYDRODIURIL) 12.5 MG tablet; Take 1 tablet (12.5 mg total) by mouth daily.  Dispense: 90 tablet; Refill: 1 - metoprolol succinate (TOPROL-XL) 100 MG 24 hr tablet; Take 1 tablet (100 mg total) by mouth daily. Take with or immediately following a meal.  Dispense:  90 tablet; Refill: 1  2. Gastroesophageal reflux disease, esophagitis presence not specified Chronic.  Controlled.  Continue pantoprazole 40 mg once a day - pantoprazole (PROTONIX) 40 MG tablet; Take 1 tablet (40 mg total) by mouth daily.  Dispense: 90 tablet; Refill: 1  3. Class 1 obesity due to excess calories with serious comorbidity and body mass index (BMI) of 32.0 to 32.9 in adult Health risks of being over weight were discussed and patient was counseled on weight loss options and exercise.  4. Cigarette nicotine dependence without complication Patient has been advised of the health risks of smoking and counseled concerning cessation of tobacco products. I spent over 3 minutes for discussion and to answer questions.  5. Noncompliance Patient has not been taking his medications making it "stretch out".  Had long discussion with patient about the necessity of taking medication and the ramifications if his blood pressure is allowed to continue at his present level.  6. Alcohol abuse, episodic Long discussion about the inappropriate levels of alcohol.  I am not sure if patient understands.

## 2018-07-10 LAB — LIPID PANEL WITH LDL/HDL RATIO
Cholesterol, Total: 242 mg/dL — ABNORMAL HIGH (ref 100–199)
HDL: 83 mg/dL (ref >39)
LDL Calculated: 135 mg/dL — ABNORMAL HIGH (ref 0–99)
LDl/HDL Ratio: 1.6 ratio (ref 0.0–3.6)
Triglycerides: 119 mg/dL (ref 0–149)
VLDL Cholesterol Cal: 24 mg/dL (ref 5–40)

## 2018-07-10 LAB — RENAL FUNCTION PANEL
Albumin: 4.6 g/dL (ref 4.0–5.0)
BUN/Creatinine Ratio: 7 — ABNORMAL LOW (ref 9–20)
BUN: 9 mg/dL (ref 6–24)
CO2: 26 mmol/L (ref 20–29)
Calcium: 9.5 mg/dL (ref 8.7–10.2)
Chloride: 95 mmol/L — ABNORMAL LOW (ref 96–106)
Creatinine, Ser: 1.26 mg/dL (ref 0.76–1.27)
GFR calc Af Amer: 76 mL/min/{1.73_m2} (ref >59)
GFR calc non Af Amer: 66 mL/min/{1.73_m2} (ref >59)
Glucose: 107 mg/dL — ABNORMAL HIGH (ref 65–99)
Phosphorus: 3.7 mg/dL (ref 2.8–4.1)
Potassium: 3.6 mmol/L (ref 3.5–5.2)
Sodium: 140 mmol/L (ref 134–144)

## 2018-07-10 LAB — HEPATIC FUNCTION PANEL
ALT: 74 IU/L — ABNORMAL HIGH (ref 0–44)
AST: 81 IU/L — ABNORMAL HIGH (ref 0–40)
Alkaline Phosphatase: 64 IU/L (ref 39–117)
Bilirubin Total: 0.8 mg/dL (ref 0.0–1.2)
Bilirubin, Direct: 0.25 mg/dL (ref 0.00–0.40)
Total Protein: 7.2 g/dL (ref 6.0–8.5)

## 2018-08-12 ENCOUNTER — Ambulatory Visit: Payer: BC Managed Care – PPO | Admitting: Family Medicine

## 2018-08-12 ENCOUNTER — Other Ambulatory Visit: Payer: Self-pay

## 2018-08-12 ENCOUNTER — Encounter: Payer: Self-pay | Admitting: Family Medicine

## 2018-08-12 VITALS — BP 138/80 | HR 80 | Ht 72.0 in | Wt 230.0 lb

## 2018-08-12 DIAGNOSIS — I10 Essential (primary) hypertension: Secondary | ICD-10-CM | POA: Diagnosis not present

## 2018-08-12 DIAGNOSIS — K219 Gastro-esophageal reflux disease without esophagitis: Secondary | ICD-10-CM

## 2018-08-12 DIAGNOSIS — E782 Mixed hyperlipidemia: Secondary | ICD-10-CM

## 2018-08-12 DIAGNOSIS — Z6832 Body mass index (BMI) 32.0-32.9, adult: Secondary | ICD-10-CM

## 2018-08-12 DIAGNOSIS — R7401 Elevation of levels of liver transaminase levels: Secondary | ICD-10-CM

## 2018-08-12 DIAGNOSIS — R740 Nonspecific elevation of levels of transaminase and lactic acid dehydrogenase [LDH]: Secondary | ICD-10-CM | POA: Diagnosis not present

## 2018-08-12 DIAGNOSIS — E6609 Other obesity due to excess calories: Secondary | ICD-10-CM

## 2018-08-12 DIAGNOSIS — F17210 Nicotine dependence, cigarettes, uncomplicated: Secondary | ICD-10-CM | POA: Diagnosis not present

## 2018-08-12 MED ORDER — HYDROCHLOROTHIAZIDE 12.5 MG PO TABS: 12.5000 mg | ORAL_TABLET | Freq: Every day | ORAL | 1 refills | Status: DC

## 2018-08-12 MED ORDER — PANTOPRAZOLE SODIUM 40 MG PO TBEC: 40.0000 mg | DELAYED_RELEASE_TABLET | Freq: Every day | ORAL | 1 refills | Status: DC

## 2018-08-12 MED ORDER — METOPROLOL SUCCINATE ER 100 MG PO TB24: 100.0000 mg | ORAL_TABLET | Freq: Every day | ORAL | 1 refills | Status: DC

## 2018-08-12 MED ORDER — AMLODIPINE BESYLATE 10 MG PO TABS: ORAL_TABLET | ORAL | 1 refills | Status: DC

## 2018-08-12 NOTE — Progress Notes (Signed)
Date:  08/12/2018   Name:  Calvin Lynch   DOB:  Nov 14, 1965   MRN:  [id number]   Chief Complaint: Follow-up (B/P check and discuss rechecking liver enzymes and starting chol med)  Hypertension This is a chronic problem. The current episode started more than 1 year ago. The problem has been waxing and waning since onset. The problem is controlled. Pertinent negatives include no anxiety, blurred vision, chest pain, headaches, malaise/fatigue, neck pain, orthopnea, palpitations, peripheral edema, PND, shortness of breath or sweats. There are no associated agents to hypertension. Past treatments include beta blockers, calcium channel blockers and ACE inhibitors. The current treatment provides moderate improvement. There are no compliance problems.  There is no history of angina, kidney disease, CAD/MI, CVA, heart failure, left ventricular hypertrophy, PVD or retinopathy. There is no history of chronic renal disease, a hypertension causing med or renovascular disease.  Hyperlipidemia This is a chronic problem. The current episode started more than 1 year ago. The problem is uncontrolled. Recent lipid tests were reviewed and are high. Exacerbating diseases include liver disease and obesity. He has no history of chronic renal disease, diabetes, hypothyroidism or nephrotic syndrome. Pertinent negatives include no chest pain, focal sensory loss, focal weakness, leg pain, myalgias or shortness of breath. He is currently on no antihyperlipidemic treatment. The current treatment provides moderate improvement of lipids. There are no compliance problems.  Risk factors for coronary artery disease include dyslipidemia and male sex.    Review of Systems  Constitutional: Negative for chills, fever and malaise/fatigue.  HENT: Negative for drooling, ear discharge, ear pain and sore throat.   Eyes: Negative for blurred vision.  Respiratory: Negative for cough, shortness of breath and wheezing.   Cardiovascular:  Negative for chest pain, palpitations, orthopnea, leg swelling and PND.  Gastrointestinal: Negative for abdominal pain, blood in stool, constipation, diarrhea and nausea.  Endocrine: Negative for polydipsia.  Genitourinary: Negative for dysuria, frequency, hematuria and urgency.  Musculoskeletal: Negative for back pain, myalgias and neck pain.  Skin: Negative for rash.  Allergic/Immunologic: Negative for environmental allergies.  Neurological: Negative for dizziness, focal weakness and headaches.  Hematological: Does not bruise/bleed easily.  Psychiatric/Behavioral: Negative for suicidal ideas. The patient is not nervous/anxious.     Patient Active Problem List   Diagnosis Date Noted  . Essential hypertension 10/13/2014  . Esophageal reflux 10/13/2014    No Known Allergies  No past surgical history on file.  Social History   Tobacco Use  . Smoking status: Current Every Day Smoker    Packs/day: 1.00    Types: Cigarettes  . Smokeless tobacco: Former Network engineer Use Topics  . Alcohol use: Yes    Alcohol/week: 0.0 standard drinks    Comment: pt states a 6 pack a day  . Drug use: No     Medication list has been reviewed and updated.  Current Meds  Medication Sig  . amLODipine (NORVASC) 10 MG tablet TAKE 1 TABLET (10 MG TOTAL) BY MOUTH DAILY AT 6 (SIX) AM.  . aspirin EC 81 MG tablet Take 81 mg by mouth daily at 6 (six) AM.  . hydrochlorothiazide (HYDRODIURIL) 12.5 MG tablet Take 1 tablet (12.5 mg total) by mouth daily.  . metoprolol succinate (TOPROL-XL) 100 MG 24 hr tablet Take 1 tablet (100 mg total) by mouth daily. Take with or immediately following a meal.  . pantoprazole (PROTONIX) 40 MG tablet Take 1 tablet (40 mg total) by mouth daily.    PHQ 2/9 Scores 08/12/2018  07/09/2018 12/29/2017 11/04/2016  PHQ - 2 Score 0 0 0 0  PHQ- 9 Score 3 0 0 1    BP Readings from Last 3 Encounters:  08/12/18 138/80  07/09/18 (!) 150/100  04/15/18 (!) 142/82    Physical Exam  Vitals signs and nursing note reviewed.  HENT:     Head: Normocephalic.     Right Ear: Tympanic membrane, ear canal and external ear normal. There is no impacted cerumen.     Left Ear: Tympanic membrane, ear canal and external ear normal. There is no impacted cerumen.     Nose: Nose normal.  Eyes:     General: No scleral icterus.       Right eye: No discharge.        Left eye: No discharge.     Conjunctiva/sclera: Conjunctivae normal.     Pupils: Pupils are equal, round, and reactive to light.  Neck:     Musculoskeletal: Normal range of motion and neck supple.     Thyroid: No thyromegaly.     Vascular: No JVD.     Trachea: No tracheal deviation.  Cardiovascular:     Rate and Rhythm: Normal rate and regular rhythm.     Heart sounds: Normal heart sounds. No murmur. No friction rub. No gallop.   Pulmonary:     Effort: No respiratory distress.     Breath sounds: Normal breath sounds. No wheezing or rales.  Abdominal:     General: Bowel sounds are normal.     Palpations: Abdomen is soft. There is no mass.     Tenderness: There is no abdominal tenderness. There is no guarding or rebound.  Musculoskeletal: Normal range of motion.        General: No tenderness.  Lymphadenopathy:     Cervical: No cervical adenopathy.  Skin:    General: Skin is warm.     Findings: No rash.  Neurological:     Mental Status: He is alert and oriented to person, place, and time.     Cranial Nerves: No cranial nerve deficit.     Deep Tendon Reflexes: Reflexes are normal and symmetric.     Wt Readings from Last 3 Encounters:  08/12/18 230 lb (104.3 kg)  07/09/18 233 lb (105.7 kg)  04/15/18 239 lb (108.4 kg)    BP 138/80   Pulse 80   Ht 6' (1.829 m)   Wt 230 lb (104.3 kg)   BMI 31.19 kg/m   Assessment and Plan: 1. Essential hypertension Chronic.  Controlled.  Continue hydrochlorothiazide 12.5 mg and metoprolol XL 100 mg and amlodipine 10 mg daily. - hydrochlorothiazide (HYDRODIURIL) 12.5 MG  tablet; Take 1 tablet (12.5 mg total) by mouth daily.  Dispense: 90 tablet; Refill: 1 - metoprolol succinate (TOPROL-XL) 100 MG 24 hr tablet; Take 1 tablet (100 mg total) by mouth daily. Take with or immediately following a meal.  Dispense: 90 tablet; Refill: 1 - amLODipine (NORVASC) 10 MG tablet; TAKE 1 TABLET (10 MG TOTAL) BY MOUTH DAILY AT 6 (SIX) AM.  Dispense: 90 tablet; Refill: 1  2. Cigarette nicotine dependence without complication Unfortunately there often cannot seem to quit smoking.  Multiple attempts have been made to assist him but I am not certain that if he is ready to give this up.  We have discussed the significant risk of continuing to smoke and patient understands.Patient has been advised of the health risks of smoking and counseled concerning cessation of tobacco products. I spent over 3 minutes for  discussion and to answer questions. 3. Moderate mixed hyperlipidemia not requiring statin therapy Chronic.  Controlled.  Diet given to patient for reduction.  4. Elevated transaminase level Previous elevation of AST and ALT.  Will recheck with hepatic function panel. - Hepatic Function Panel (6)  5. Class 1 obesity due to excess calories with serious comorbidity and body mass index (BMI) of 32.0 to 32.9 in adult Health risks of being over weight were discussed and patient was counseled on weight loss options and exercise.  6. Gastroesophageal reflux disease, esophagitis presence not specified Chronic.  Persistent.  Continue pantoprazole 40 mg once a day.- pantoprazole (PROTONIX) 40 MG tablet; Take 1 tablet (40 mg total) by mouth daily.  Dispense: 90 tablet; Refill: 1

## 2018-08-12 NOTE — Patient Instructions (Signed)

## 2018-08-13 LAB — HEPATIC FUNCTION PANEL (6)
ALT: 32 IU/L (ref 0–44)
AST: 40 IU/L (ref 0–40)
Albumin: 4.6 g/dL (ref 4.0–5.0)
Alkaline Phosphatase: 60 IU/L (ref 39–117)
Bilirubin Total: 0.3 mg/dL (ref 0.0–1.2)
Bilirubin, Direct: 0.13 mg/dL (ref 0.00–0.40)

## 2019-02-15 ENCOUNTER — Encounter: Payer: Self-pay | Admitting: Family Medicine

## 2019-02-15 ENCOUNTER — Other Ambulatory Visit: Payer: Self-pay

## 2019-02-15 ENCOUNTER — Ambulatory Visit: Payer: BC Managed Care – PPO | Admitting: Family Medicine

## 2019-02-15 VITALS — BP 132/80 | HR 64 | Ht 72.0 in | Wt 233.0 lb

## 2019-02-15 DIAGNOSIS — E7801 Familial hypercholesterolemia: Secondary | ICD-10-CM | POA: Diagnosis not present

## 2019-02-15 DIAGNOSIS — K219 Gastro-esophageal reflux disease without esophagitis: Secondary | ICD-10-CM

## 2019-02-15 DIAGNOSIS — I10 Essential (primary) hypertension: Secondary | ICD-10-CM

## 2019-02-15 MED ORDER — HYDROCHLOROTHIAZIDE 12.5 MG PO TABS: 12.5000 mg | ORAL_TABLET | Freq: Every day | ORAL | 1 refills | Status: DC

## 2019-02-15 MED ORDER — METOPROLOL SUCCINATE ER 100 MG PO TB24: 100.0000 mg | ORAL_TABLET | Freq: Every day | ORAL | 1 refills | Status: DC

## 2019-02-15 MED ORDER — PANTOPRAZOLE SODIUM 40 MG PO TBEC: 40.0000 mg | DELAYED_RELEASE_TABLET | Freq: Every day | ORAL | 1 refills | Status: DC

## 2019-02-15 MED ORDER — AMLODIPINE BESYLATE 10 MG PO TABS: ORAL_TABLET | ORAL | 1 refills | Status: DC

## 2019-02-15 NOTE — Progress Notes (Signed)
Date:  02/15/2019   Name:  Calvin Lynch   DOB:  09/11/1965   MRN:  [id number]   Chief Complaint: Hypertension and Gastroesophageal Reflux  Hypertension This is a chronic problem. The current episode started more than 1 year ago. The problem has been gradually improving since onset. The problem is controlled. Pertinent negatives include no anxiety, blurred vision, chest pain, headaches, malaise/fatigue, neck pain, orthopnea, palpitations, peripheral edema, PND, shortness of breath or sweats. There are no associated agents to hypertension. Risk factors for coronary artery disease include dyslipidemia, obesity and smoking/tobacco exposure. Past treatments include beta blockers, calcium channel blockers and diuretics. The current treatment provides moderate improvement. There are no compliance problems.  There is no history of angina, kidney disease, CAD/MI, CVA, heart failure, left ventricular hypertrophy, PVD or retinopathy. There is no history of chronic renal disease, a hypertension causing med or renovascular disease.  Gastroesophageal Reflux He reports no abdominal pain, no belching, no chest pain, no choking, no coughing, no dysphagia, no early satiety, no globus sensation, no heartburn, no hoarse voice, no nausea, no sore throat, no stridor, no tooth decay, no water brash or no wheezing. This is a chronic problem. The problem has been gradually improving. Nothing aggravates the symptoms. Pertinent negatives include no anemia, fatigue, melena, muscle weakness, orthopnea or weight loss. Risk factors include obesity. He has tried a PPI for the symptoms. The treatment provided moderate relief.  Hyperlipidemia This is a chronic problem. The current episode started more than 1 year ago. The problem is controlled. Recent lipid tests were reviewed and are normal. He has no history of chronic renal disease, diabetes, hypothyroidism, liver disease, obesity or nephrotic syndrome. Pertinent negatives  include no chest pain, focal sensory loss, focal weakness, leg pain, myalgias or shortness of breath. He is currently on no antihyperlipidemic treatment. Compliance problems include adherence to diet.  Risk factors for coronary artery disease include dyslipidemia, male sex, stress and obesity.    Lab Results  Component Value Date   CREATININE 1.26 07/09/2018   BUN 9 07/09/2018   NA 140 07/09/2018   K 3.6 07/09/2018   CL 95 (L) 07/09/2018   CO2 26 07/09/2018   Lab Results  Component Value Date   CHOL 242 (H) 07/09/2018   HDL 83 07/09/2018   LDLCALC 135 (H) 07/09/2018   TRIG 119 07/09/2018   No results found for: TSH No results found for: HGBA1C   Review of Systems  Constitutional: Negative for chills, fatigue, fever, malaise/fatigue and weight loss.  HENT: Negative for drooling, ear discharge, ear pain, hoarse voice and sore throat.   Eyes: Negative for blurred vision.  Respiratory: Negative for cough, choking, shortness of breath and wheezing.   Cardiovascular: Negative for chest pain, palpitations, orthopnea, leg swelling and PND.  Gastrointestinal: Negative for abdominal pain, blood in stool, constipation, diarrhea, dysphagia, heartburn, melena and nausea.  Endocrine: Negative for polydipsia.  Genitourinary: Negative for dysuria, frequency, hematuria and urgency.  Musculoskeletal: Negative for back pain, myalgias, muscle weakness and neck pain.  Skin: Negative for rash.  Allergic/Immunologic: Negative for environmental allergies.  Neurological: Negative for dizziness, focal weakness and headaches.  Hematological: Does not bruise/bleed easily.  Psychiatric/Behavioral: Negative for suicidal ideas. The patient is not nervous/anxious.     Patient Active Problem List   Diagnosis Date Noted  . Essential hypertension 10/13/2014  . Esophageal reflux 10/13/2014    No Known Allergies  No past surgical history on file.  Social History   Tobacco  Use  . Smoking status:  Current Every Day Smoker    Packs/day: 1.00    Types: Cigarettes  . Smokeless tobacco: Former Engineer, waterUser  Substance Use Topics  . Alcohol use: Yes    Alcohol/week: 0.0 standard drinks    Comment: pt states a 6 pack a day  . Drug use: No     Medication list has been reviewed and updated.  Current Meds  Medication Sig  . amLODipine (NORVASC) 10 MG tablet TAKE 1 TABLET (10 MG TOTAL) BY MOUTH DAILY AT 6 (SIX) AM.  . aspirin EC 81 MG tablet Take 81 mg by mouth daily at 6 (six) AM.  . hydrochlorothiazide (HYDRODIURIL) 12.5 MG tablet Take 1 tablet (12.5 mg total) by mouth daily.  . metoprolol succinate (TOPROL-XL) 100 MG 24 hr tablet Take 1 tablet (100 mg total) by mouth daily. Take with or immediately following a meal.  . pantoprazole (PROTONIX) 40 MG tablet Take 1 tablet (40 mg total) by mouth daily.    PHQ 2/9 Scores 02/15/2019 08/12/2018 07/09/2018 12/29/2017  PHQ - 2 Score 0 0 0 0  PHQ- 9 Score 0 3 0 0    BP Readings from Last 3 Encounters:  02/15/19 132/80  08/12/18 138/80  07/09/18 (!) 150/100    Physical Exam Vitals and nursing note reviewed.  HENT:     Head: Normocephalic.     Right Ear: Tympanic membrane, ear canal and external ear normal.     Left Ear: Tympanic membrane, ear canal and external ear normal.     Nose: Nose normal.     Mouth/Throat:     Mouth: Mucous membranes are moist.     Pharynx: Oropharynx is clear.  Eyes:     General: No scleral icterus.       Right eye: No discharge.        Left eye: No discharge.     Conjunctiva/sclera: Conjunctivae normal.     Pupils: Pupils are equal, round, and reactive to light.  Neck:     Thyroid: No thyromegaly.     Vascular: No JVD.     Trachea: No tracheal deviation.  Cardiovascular:     Rate and Rhythm: Normal rate and regular rhythm.     Heart sounds: Normal heart sounds, S1 normal and S2 normal. No murmur. No systolic murmur. No diastolic murmur. No friction rub. No gallop. No S3 or S4 sounds.   Pulmonary:      Effort: No respiratory distress.     Breath sounds: Normal breath sounds. No decreased breath sounds, wheezing, rhonchi or rales.  Abdominal:     General: Bowel sounds are normal.     Palpations: Abdomen is soft. There is no hepatomegaly, splenomegaly or mass.     Tenderness: There is no abdominal tenderness. There is no guarding or rebound.  Musculoskeletal:        General: No tenderness. Normal range of motion.     Cervical back: Normal range of motion and neck supple.     Right lower leg: No edema.     Left lower leg: No edema.  Lymphadenopathy:     Cervical: No cervical adenopathy.  Skin:    General: Skin is warm.     Findings: No rash.  Neurological:     Mental Status: He is alert and oriented to person, place, and time.     Cranial Nerves: No cranial nerve deficit.     Deep Tendon Reflexes: Reflexes are normal and symmetric.  Wt Readings from Last 3 Encounters:  02/15/19 233 lb (105.7 kg)  08/12/18 230 lb (104.3 kg)  07/09/18 233 lb (105.7 kg)    BP 132/80   Pulse 64   Ht 6' (1.829 m)   Wt 233 lb (105.7 kg)   BMI 31.60 kg/m   Assessment and Plan: 1. Essential hypertension Chronic.  Controlled.  Stable.  No symptomatology.  Will continue amlodipine 10 mg once a day.  Metoprolol XL 100 mg once a day, and hydrochlorothiazide 12.5 mg once a day.  Review of previous renal function panel within acceptable limits. - amLODipine (NORVASC) 10 MG tablet; TAKE 1 TABLET (10 MG TOTAL) BY MOUTH DAILY AT 6 (SIX) AM.  Dispense: 90 tablet; Refill: 1 - metoprolol succinate (TOPROL-XL) 100 MG 24 hr tablet; Take 1 tablet (100 mg total) by mouth daily. Take with or immediately following a meal.  Dispense: 90 tablet; Refill: 1 - hydrochlorothiazide (HYDRODIURIL) 12.5 MG tablet; Take 1 tablet (12.5 mg total) by mouth daily.  Dispense: 90 tablet; Refill: 1  2. Gastroesophageal reflux disease, unspecified whether esophagitis present Chronic.  Controlled.  Stable.  Continue pantoprazole 40  mg once a day. - pantoprazole (PROTONIX) 40 MG tablet; Take 1 tablet (40 mg total) by mouth daily.  Dispense: 90 tablet; Refill: 1  3. Familial hypercholesterolemia Previous lipid panel has noted elevations of LDL.  Patient is fasting and we will check a lipid panel.  Have had reluctance going to add a statin because of elevation of LFTs presumably from alcohol but the last time this was checked and patient had curtailed alcohol intake this was in an acceptable range.  Patient has been given a low-cholesterol diet in case there is elevation that needs to be addressed - Lipid panel

## 2019-02-15 NOTE — Patient Instructions (Signed)

## 2019-02-16 LAB — LIPID PANEL
Chol/HDL Ratio: 3.6 ratio (ref 0.0–5.0)
Cholesterol, Total: 258 mg/dL — ABNORMAL HIGH (ref 100–199)
HDL: 72 mg/dL (ref >39)
LDL Chol Calc (NIH): 156 mg/dL — ABNORMAL HIGH (ref 0–99)
Triglycerides: 166 mg/dL — ABNORMAL HIGH (ref 0–149)
VLDL Cholesterol Cal: 30 mg/dL (ref 5–40)

## 2019-02-17 ENCOUNTER — Other Ambulatory Visit: Payer: Self-pay

## 2019-02-17 DIAGNOSIS — E7801 Familial hypercholesterolemia: Secondary | ICD-10-CM

## 2019-02-17 MED ORDER — ATORVASTATIN CALCIUM 10 MG PO TABS: 10.0000 mg | ORAL_TABLET | Freq: Every day | ORAL | 1 refills | Status: DC

## 2019-02-17 NOTE — Progress Notes (Unsigned)
Sent in lipitor 10mg  qday to [facility] [place]

## 2019-03-17 ENCOUNTER — Other Ambulatory Visit: Payer: Self-pay | Admitting: Family Medicine

## 2019-03-17 DIAGNOSIS — E7801 Familial hypercholesterolemia: Secondary | ICD-10-CM

## 2019-03-29 ENCOUNTER — Other Ambulatory Visit: Payer: BC Managed Care – PPO

## 2019-03-29 ENCOUNTER — Other Ambulatory Visit: Payer: Self-pay

## 2019-03-29 DIAGNOSIS — E7801 Familial hypercholesterolemia: Secondary | ICD-10-CM | POA: Diagnosis not present

## 2019-03-29 DIAGNOSIS — R69 Illness, unspecified: Secondary | ICD-10-CM | POA: Diagnosis not present

## 2019-03-30 ENCOUNTER — Other Ambulatory Visit: Payer: Self-pay

## 2019-03-30 DIAGNOSIS — E7801 Familial hypercholesterolemia: Secondary | ICD-10-CM

## 2019-03-30 LAB — HEPATIC FUNCTION PANEL
ALT: 32 IU/L (ref 0–44)
AST: 35 IU/L (ref 0–40)
Albumin: 4.6 g/dL (ref 3.8–4.9)
Alkaline Phosphatase: 73 IU/L (ref 39–117)
Bilirubin Total: 0.6 mg/dL (ref 0.0–1.2)
Bilirubin, Direct: 0.19 mg/dL (ref 0.00–0.40)
Total Protein: 7.4 g/dL (ref 6.0–8.5)

## 2019-03-30 LAB — LIPID PANEL WITH LDL/HDL RATIO
Cholesterol, Total: 180 mg/dL (ref 100–199)
HDL: 60 mg/dL (ref >39)
LDL Chol Calc (NIH): 95 mg/dL (ref 0–99)
LDL/HDL Ratio: 1.6 ratio (ref 0.0–3.6)
Triglycerides: 145 mg/dL (ref 0–149)
VLDL Cholesterol Cal: 25 mg/dL (ref 5–40)

## 2019-03-30 MED ORDER — ATORVASTATIN CALCIUM 10 MG PO TABS: 10.0000 mg | ORAL_TABLET | Freq: Every day | ORAL | 1 refills | Status: DC

## 2019-03-30 NOTE — Progress Notes (Unsigned)
Sent in Atorv 

## 2019-08-17 ENCOUNTER — Ambulatory Visit: Payer: BC Managed Care – PPO | Admitting: Family Medicine

## 2019-11-25 DIAGNOSIS — R6883 Chills (without fever): Secondary | ICD-10-CM | POA: Diagnosis not present

## 2019-11-25 DIAGNOSIS — M791 Myalgia, unspecified site: Secondary | ICD-10-CM | POA: Diagnosis not present

## 2019-11-25 DIAGNOSIS — B349 Viral infection, unspecified: Secondary | ICD-10-CM | POA: Diagnosis not present

## 2019-11-25 DIAGNOSIS — R0981 Nasal congestion: Secondary | ICD-10-CM | POA: Diagnosis not present

## 2020-04-13 ENCOUNTER — Telehealth: Payer: Self-pay

## 2020-04-13 NOTE — Telephone Encounter (Signed)
Copied from CRM [id number]. Topic: General - Other >> Apr 13, 2020  8:52 AM Jaquita Rector A wrote: Reason for CRM: Patient called in asking for Delice Bison to please give him a call back at Ph#  [phone]

## 2020-04-13 NOTE — Telephone Encounter (Signed)
Called and scheduled for Monday morning

## 2020-04-16 ENCOUNTER — Other Ambulatory Visit: Payer: Self-pay

## 2020-04-16 ENCOUNTER — Encounter: Payer: Self-pay | Admitting: Family Medicine

## 2020-04-16 ENCOUNTER — Ambulatory Visit (INDEPENDENT_AMBULATORY_CARE_PROVIDER_SITE_OTHER): Payer: BC Managed Care – PPO | Admitting: Family Medicine

## 2020-04-16 VITALS — BP 152/100 | HR 88 | Ht 72.0 in | Wt 223.0 lb

## 2020-04-16 DIAGNOSIS — F17210 Nicotine dependence, cigarettes, uncomplicated: Secondary | ICD-10-CM

## 2020-04-16 DIAGNOSIS — E7801 Familial hypercholesterolemia: Secondary | ICD-10-CM

## 2020-04-16 DIAGNOSIS — F1010 Alcohol abuse, uncomplicated: Secondary | ICD-10-CM

## 2020-04-16 DIAGNOSIS — K219 Gastro-esophageal reflux disease without esophagitis: Secondary | ICD-10-CM

## 2020-04-16 DIAGNOSIS — Z9119 Patient's noncompliance with other medical treatment and regimen: Secondary | ICD-10-CM

## 2020-04-16 DIAGNOSIS — I10 Essential (primary) hypertension: Secondary | ICD-10-CM | POA: Diagnosis not present

## 2020-04-16 DIAGNOSIS — Z91199 Patient's noncompliance with other medical treatment and regimen due to unspecified reason: Secondary | ICD-10-CM

## 2020-04-16 DIAGNOSIS — R634 Abnormal weight loss: Secondary | ICD-10-CM

## 2020-04-16 MED ORDER — AMLODIPINE BESYLATE 10 MG PO TABS: ORAL_TABLET | ORAL | 1 refills | Status: DC

## 2020-04-16 MED ORDER — ATORVASTATIN CALCIUM 10 MG PO TABS: 10.0000 mg | ORAL_TABLET | Freq: Every day | ORAL | 1 refills | Status: DC

## 2020-04-16 MED ORDER — METOPROLOL SUCCINATE ER 100 MG PO TB24: 100.0000 mg | ORAL_TABLET | Freq: Every day | ORAL | 0 refills | Status: DC

## 2020-04-16 MED ORDER — HYDROCHLOROTHIAZIDE 12.5 MG PO TABS: 12.5000 mg | ORAL_TABLET | Freq: Every day | ORAL | 0 refills | Status: DC

## 2020-04-16 MED ORDER — PANTOPRAZOLE SODIUM 40 MG PO TBEC: 40.0000 mg | DELAYED_RELEASE_TABLET | Freq: Every day | ORAL | 0 refills | Status: DC

## 2020-04-16 NOTE — Progress Notes (Signed)
Date:  04/16/2020   Name:  Calvin Lynch   DOB:  01/18/1966   MRN:  [id number]   Chief Complaint: Hypertension, Hyperlipidemia, and Gastroesophageal Reflux  Hypertension This is a chronic problem. The current episode started more than 1 year ago. The problem has been gradually worsening since onset. The problem is uncontrolled. Pertinent negatives include no anxiety, blurred vision, chest pain, headaches, malaise/fatigue, neck pain, orthopnea, palpitations, peripheral edema, PND, shortness of breath or sweats. There are no associated agents to hypertension. Risk factors for coronary artery disease include dyslipidemia. Past treatments include beta blockers, calcium channel blockers and diuretics. The current treatment provides moderate improvement. There are no compliance problems.  There is no history of angina, kidney disease, CAD/MI, CVA, heart failure, left ventricular hypertrophy, PVD or retinopathy. There is no history of chronic renal disease, a hypertension causing med or renovascular disease.  Hyperlipidemia This is a chronic problem. The current episode started more than 1 year ago. The problem is controlled. Recent lipid tests were reviewed and are normal. He has no history of chronic renal disease, hypothyroidism, liver disease, obesity or nephrotic syndrome. There are no known factors aggravating his hyperlipidemia. Pertinent negatives include no chest pain, myalgias or shortness of breath. The current treatment provides moderate improvement of lipids. There are no compliance problems.  Risk factors for coronary artery disease include dyslipidemia.  Gastroesophageal Reflux He reports no abdominal pain, no belching, no chest pain, no choking, no coughing, no dysphagia, no early satiety, no globus sensation, no heartburn, no hoarse voice, no nausea, no sore throat, no stridor or no wheezing. This is a chronic problem. The current episode started more than 1 year ago. The problem occurs  frequently. The problem has been gradually worsening. The symptoms are aggravated by certain foods. Pertinent negatives include no anemia, fatigue, melena, muscle weakness, orthopnea or weight loss. He has tried a PPI for the symptoms. The treatment provided moderate relief.    Lab Results  Component Value Date   CREATININE 1.26 07/09/2018   BUN 9 07/09/2018   NA 140 07/09/2018   K 3.6 07/09/2018   CL 95 (L) 07/09/2018   CO2 26 07/09/2018   Lab Results  Component Value Date   CHOL 180 03/29/2019   HDL 60 03/29/2019   LDLCALC 95 03/29/2019   TRIG 145 03/29/2019   CHOLHDL 3.6 02/15/2019   No results found for: TSH No results found for: HGBA1C Lab Results  Component Value Date   WBC 6.9 10/13/2014   HGB 15.0 10/13/2014   HCT 45.2 10/13/2014   MCV 95.1 10/13/2014   PLT 280 10/13/2014   Lab Results  Component Value Date   ALT 32 03/29/2019   AST 35 03/29/2019   ALKPHOS 73 03/29/2019   BILITOT 0.6 03/29/2019     Review of Systems  Constitutional: Negative for chills, fatigue, fever, malaise/fatigue and weight loss.  HENT: Negative for drooling, ear discharge, ear pain, hoarse voice and sore throat.   Eyes: Negative for blurred vision.  Respiratory: Negative for cough, choking, shortness of breath and wheezing.   Cardiovascular: Negative for chest pain, palpitations, orthopnea, leg swelling and PND.  Gastrointestinal: Negative for abdominal pain, blood in stool, constipation, diarrhea, dysphagia, heartburn, melena and nausea.  Endocrine: Negative for polydipsia.  Genitourinary: Negative for dysuria, frequency, hematuria and urgency.  Musculoskeletal: Negative for back pain, myalgias, muscle weakness and neck pain.  Skin: Negative for rash.  Allergic/Immunologic: Negative for environmental allergies.  Neurological: Negative for dizziness and  headaches.  Hematological: Does not bruise/bleed easily.  Psychiatric/Behavioral: Negative for suicidal ideas. The patient is not  nervous/anxious.     Patient Active Problem List   Diagnosis Date Noted  . Essential hypertension 10/13/2014  . Esophageal reflux 10/13/2014    No Known Allergies  No past surgical history on file.  Social History   Tobacco Use  . Smoking status: Current Every Day Smoker    Packs/day: 1.00    Types: Cigarettes  . Smokeless tobacco: Former Clinical biochemist  . Vaping Use: Never used  Substance Use Topics  . Alcohol use: Yes    Alcohol/week: 0.0 standard drinks    Comment: pt states a 6 pack a day  . Drug use: No     Medication list has been reviewed and updated.  Current Meds  Medication Sig  . amLODipine (NORVASC) 10 MG tablet TAKE 1 TABLET (10 MG TOTAL) BY MOUTH DAILY AT 6 (SIX) AM.  . aspirin EC 81 MG tablet Take 81 mg by mouth daily at 6 (six) AM.  . atorvastatin (LIPITOR) 10 MG tablet Take 1 tablet (10 mg total) by mouth daily.  . hydrochlorothiazide (HYDRODIURIL) 12.5 MG tablet Take 1 tablet (12.5 mg total) by mouth daily.  . metoprolol succinate (TOPROL-XL) 100 MG 24 hr tablet Take 1 tablet (100 mg total) by mouth daily. Take with or immediately following a meal.  . pantoprazole (PROTONIX) 40 MG tablet Take 1 tablet (40 mg total) by mouth daily.    PHQ 2/9 Scores 04/16/2020 02/15/2019 08/12/2018 07/09/2018  PHQ - 2 Score 0 0 0 0  PHQ- 9 Score 1 0 3 0    GAD 7 : Generalized Anxiety Score 04/16/2020 02/15/2019  Nervous, Anxious, on Edge 0 0  Control/stop worrying 0 0  Worry too much - different things 0 0  Trouble relaxing 0 0  Restless 0 0  Easily annoyed or irritable 0 0  Afraid - awful might happen 0 0  Total GAD 7 Score 0 0    BP Readings from Last 3 Encounters:  04/16/20 (!) 152/100  02/15/19 132/80  08/12/18 138/80    Physical Exam Vitals and nursing note reviewed.  HENT:     Head: Normocephalic.     Right Ear: Tympanic membrane, ear canal and external ear normal.     Left Ear: Tympanic membrane, ear canal and external ear normal.     Nose:  Nose normal.     Mouth/Throat:     Mouth: Oropharynx is clear and moist.  Eyes:     General: No scleral icterus.       Right eye: No discharge.        Left eye: No discharge.     Extraocular Movements: EOM normal.     Conjunctiva/sclera: Conjunctivae normal.     Pupils: Pupils are equal, round, and reactive to light.  Neck:     Thyroid: No thyromegaly.     Vascular: No JVD.     Trachea: No tracheal deviation.  Cardiovascular:     Rate and Rhythm: Normal rate and regular rhythm.     Pulses: Intact distal pulses.     Heart sounds: Normal heart sounds. No murmur heard. No friction rub. No gallop.   Pulmonary:     Effort: No respiratory distress.     Breath sounds: Normal breath sounds. No wheezing, rhonchi or rales.  Abdominal:     General: Bowel sounds are normal.     Palpations: Abdomen is soft. There  is no hepatosplenomegaly or mass.     Tenderness: There is no abdominal tenderness. There is no CVA tenderness, guarding or rebound.  Genitourinary:    Prostate: Normal. Not enlarged, not tender and no nodules present.     Rectum: Normal. Guaiac result negative. No mass.  Musculoskeletal:        General: No tenderness or edema. Normal range of motion.     Cervical back: Normal range of motion and neck supple.  Lymphadenopathy:     Cervical: No cervical adenopathy.  Skin:    General: Skin is warm.     Findings: No rash.  Neurological:     Mental Status: He is alert and oriented to person, place, and time.     Cranial Nerves: No cranial nerve deficit.     Deep Tendon Reflexes: Strength normal and reflexes are normal and symmetric.     Wt Readings from Last 3 Encounters:  04/16/20 223 lb (101.2 kg)  02/15/19 233 lb (105.7 kg)  08/12/18 230 lb (104.3 kg)    BP (!) 152/100   Pulse 88   Ht 6' (1.829 m)   Wt 223 lb (101.2 kg)   BMI 30.24 kg/m   Assessment and Plan: 1. Essential hypertension Chronic.  Uncontrolled.  Stable.  Blood pressure reading today is 152/100.   Patient has not been taking his medication he has been out for several months.  We will resume his amlodipine 10 mg once a day, and hydrochlorothiazide 12.5 mg once a day, and metoprolol XL 100 mg once a day.  We will recheck patient in 4 weeks. - amLODipine (NORVASC) 10 MG tablet; TAKE 1 TABLET (10 MG TOTAL) BY MOUTH DAILY AT 6 (SIX) AM.  Dispense: 90 tablet; Refill: 1 - hydrochlorothiazide (HYDRODIURIL) 12.5 MG tablet; Take 1 tablet (12.5 mg total) by mouth daily.  Dispense: 90 tablet; Refill: 0 - metoprolol succinate (TOPROL-XL) 100 MG 24 hr tablet; Take 1 tablet (100 mg total) by mouth daily. Take with or immediately following a meal.  Dispense: 90 tablet; Refill: 0  2. Familial hypercholesterolemi chronic.  Uncontrolled.  Stable.                                           We will resume atorvastatin 10 mg once a day.  And will recheck patient fasting in 4 weeks. - atorvastatin (LIPITOR) 10 MG tablet; Take 1 tablet (10 mg total) by mouth daily.  Dispense: 90 tablet; Refill: 1  3. Gastroesophageal reflux disease, unspecified whether esophagitis present Chronic.  Controlled.  Stable.  Patient is probably not been taking his pantoprazole and has been drinking excessively for months.  We will resume pantoprazole 40 mg once a day and will recheck in 4 weeks. - pantoprazole (PROTONIX) 40 MG tablet; Take 1 tablet (40 mg total) by mouth daily.  Dispense: 90 tablet; Refill: 0  4. Noncompliance Patient has not been taking any medications for several months has been drinking alcohol.  We have had discussions about taking medication and the availability to come in is always present.  Patient has stated that he will resume his medications and will return for recheck in 4 weeks.  5. Alcohol abuse, episodic Patient has been primarily drinking alcohol and not eating which has resulted in nonintentional weight loss as noted below.  Patient has other risk factors that may be of concern and we will recheck  with  medication recheck in 4 weeks at which time we will do labs.  6. Weight loss, non-intentional Patient has had a 10 pound weight loss since last visit which is probably secondary to his lack of sufficient carb hydrates and substituting alcohol for his intake.  This is been discussed with patient he states that he has not had any alcohol in 3 weeks.  Will recheck in 4 weeks  7. Cigarette nicotine dependence without complication Patient has a greater than 30-pack-year history of smoking and with unintentional weight loss we will refer for low-dose CT scan of the lungs to rule out pulmonary concern.

## 2020-04-27 ENCOUNTER — Telehealth: Payer: Self-pay | Admitting: *Deleted

## 2020-04-27 DIAGNOSIS — Z122 Encounter for screening for malignant neoplasm of respiratory organs: Secondary | ICD-10-CM

## 2020-04-27 DIAGNOSIS — F17200 Nicotine dependence, unspecified, uncomplicated: Secondary | ICD-10-CM

## 2020-04-27 DIAGNOSIS — Z87891 Personal history of nicotine dependence: Secondary | ICD-10-CM

## 2020-04-27 NOTE — Telephone Encounter (Signed)
Received referral for initial lung cancer screening scan. Contacted patient and obtained smoking history,(current, 36 pack year) as well as answering questions related to screening process. Patient denies signs of lung cancer such as weight loss or hemoptysis. Patient denies comorbidity that would prevent curative treatment if lung cancer were found. Patient is scheduled for shared decision making visit and CT scan on 05/16/20 at 10am.

## 2020-05-16 ENCOUNTER — Other Ambulatory Visit: Payer: Self-pay

## 2020-05-16 ENCOUNTER — Ambulatory Visit
Admission: RE | Admit: 2020-05-16 | Discharge: 2020-05-16 | Disposition: A | Discharge status: Home / Self Care | Payer: BC Managed Care – PPO | Source: Ambulatory Visit | Attending: Oncology | Admitting: Oncology

## 2020-05-16 ENCOUNTER — Inpatient Hospital Stay: Payer: BC Managed Care – PPO | Attending: Oncology | Admitting: Oncology

## 2020-05-16 ENCOUNTER — Ambulatory Visit (INDEPENDENT_AMBULATORY_CARE_PROVIDER_SITE_OTHER): Payer: BC Managed Care – PPO | Admitting: Family Medicine

## 2020-05-16 ENCOUNTER — Encounter: Payer: Self-pay | Admitting: *Deleted

## 2020-05-16 VITALS — BP 138/80 | HR 68 | Ht 72.0 in | Wt 228.0 lb

## 2020-05-16 DIAGNOSIS — F17200 Nicotine dependence, unspecified, uncomplicated: Secondary | ICD-10-CM

## 2020-05-16 DIAGNOSIS — Z87891 Personal history of nicotine dependence: Secondary | ICD-10-CM | POA: Insufficient documentation

## 2020-05-16 DIAGNOSIS — E7801 Familial hypercholesterolemia: Secondary | ICD-10-CM

## 2020-05-16 DIAGNOSIS — K219 Gastro-esophageal reflux disease without esophagitis: Secondary | ICD-10-CM

## 2020-05-16 DIAGNOSIS — Z122 Encounter for screening for malignant neoplasm of respiratory organs: Secondary | ICD-10-CM

## 2020-05-16 DIAGNOSIS — I10 Essential (primary) hypertension: Secondary | ICD-10-CM | POA: Diagnosis not present

## 2020-05-16 DIAGNOSIS — F17210 Nicotine dependence, cigarettes, uncomplicated: Secondary | ICD-10-CM | POA: Diagnosis not present

## 2020-05-16 MED ORDER — METOPROLOL SUCCINATE ER 100 MG PO TB24: 100.0000 mg | ORAL_TABLET | Freq: Every day | ORAL | 1 refills | Status: DC

## 2020-05-16 MED ORDER — HYDROCHLOROTHIAZIDE 12.5 MG PO TABS: 12.5000 mg | ORAL_TABLET | Freq: Every day | ORAL | 1 refills | Status: DC

## 2020-05-16 MED ORDER — PANTOPRAZOLE SODIUM 40 MG PO TBEC: 40.0000 mg | DELAYED_RELEASE_TABLET | Freq: Every day | ORAL | 1 refills | Status: DC

## 2020-05-16 MED ORDER — AMLODIPINE BESYLATE 10 MG PO TABS: ORAL_TABLET | ORAL | 1 refills | Status: DC

## 2020-05-16 MED ORDER — ATORVASTATIN CALCIUM 10 MG PO TABS: 10.0000 mg | ORAL_TABLET | Freq: Every day | ORAL | 1 refills | Status: DC

## 2020-05-16 NOTE — Progress Notes (Signed)
Virtual Visit via Video Note  I connected with Calvin Lynch on 05/16/20 at 10:00 AM EDT by a video enabled telemedicine application and verified that I am speaking with the correct person using two identifiers.  Location: Patient: [facility] Provider: Clinic   I discussed the limitations of evaluation and management by telemedicine and the availability of in person appointments. The patient expressed understanding and agreed to proceed.  I discussed the assessment and treatment plan with the patient. The patient was provided an opportunity to ask questions and all were answered. The patient agreed with the plan and demonstrated an understanding of the instructions.   The patient was advised to call back or seek an in-person evaluation if the symptoms worsen or if the condition fails to improve as anticipated.   In accordance with CMS guidelines, patient has met eligibility criteria including age, absence of signs or symptoms of lung cancer.  Social History   Tobacco Use  . Smoking status: Current Every Day Smoker    Packs/day: 1.00    Years: 36.00    Pack years: 36.00    Types: Cigarettes  . Smokeless tobacco: Former Network engineer  . Vaping Use: Never used  Substance Use Topics  . Alcohol use: Yes    Alcohol/week: 0.0 standard drinks    Comment: pt states a 6 pack a day  . Drug use: No      A shared decision-making session was conducted prior to the performance of CT scan. This includes one or more decision aids, includes benefits and harms of screening, follow-up diagnostic testing, over-diagnosis, false positive rate, and total radiation exposure.   Counseling on the importance of adherence to annual lung cancer LDCT screening, impact of co-morbidities, and ability or willingness to undergo diagnosis and treatment is imperative for compliance of the program.   Counseling on the importance of continued smoking cessation for former smokers; the importance of smoking cessation for  current smokers, and information about tobacco cessation interventions have been given to patient including [place] and [facility] quit [place] programs.   Written order for lung cancer screening with LDCT has been given to the patient and any and all questions have been answered to the best of my abilities.    Yearly follow up will be coordinated by Burgess Estelle, Thoracic Navigator.  I provided 15 minutes of face-to-face video visit time during this encounter, and > 50% was spent counseling as documented under my assessment & plan.   Jacquelin Hawking, NP

## 2020-05-16 NOTE — Progress Notes (Signed)
Date:  05/16/2020   Name:  Calvin Lynch   DOB:  03-Jul-1965   MRN:  [id number]   Chief Complaint: Hypertension (Bp recheck)  Hypertension This is a chronic problem. The current episode started more than 1 year ago. The problem has been gradually improving since onset. The problem is controlled. Pertinent negatives include no anxiety, blurred vision, chest pain, headaches, malaise/fatigue, neck pain, orthopnea, palpitations, peripheral edema, PND, shortness of breath or sweats. There are no associated agents to hypertension. There are no known risk factors for coronary artery disease. Past treatments include calcium channel blockers, beta blockers and diuretics. The current treatment provides moderate improvement. There are no compliance problems.  There is no history of angina, kidney disease, CAD/MI, CVA, heart failure, left ventricular hypertrophy, PVD or retinopathy. There is no history of chronic renal disease, a hypertension causing med or renovascular disease.    Lab Results  Component Value Date   CREATININE 1.26 07/09/2018   BUN 9 07/09/2018   NA 140 07/09/2018   K 3.6 07/09/2018   CL 95 (L) 07/09/2018   CO2 26 07/09/2018   Lab Results  Component Value Date   CHOL 180 03/29/2019   HDL 60 03/29/2019   LDLCALC 95 03/29/2019   TRIG 145 03/29/2019   CHOLHDL 3.6 02/15/2019   No results found for: TSH No results found for: HGBA1C Lab Results  Component Value Date   WBC 6.9 10/13/2014   HGB 15.0 10/13/2014   HCT 45.2 10/13/2014   MCV 95.1 10/13/2014   PLT 280 10/13/2014   Lab Results  Component Value Date   ALT 32 03/29/2019   AST 35 03/29/2019   ALKPHOS 73 03/29/2019   BILITOT 0.6 03/29/2019     Review of Systems  Constitutional: Negative for chills, fever and malaise/fatigue.  HENT: Negative for drooling, ear discharge, ear pain and sore throat.   Eyes: Negative for blurred vision.  Respiratory: Negative for cough, shortness of breath and wheezing.    Cardiovascular: Negative for chest pain, palpitations, orthopnea, leg swelling and PND.  Gastrointestinal: Negative for abdominal pain, blood in stool, constipation, diarrhea and nausea.  Endocrine: Negative for polydipsia.  Genitourinary: Negative for dysuria, frequency, hematuria and urgency.  Musculoskeletal: Negative for back pain, myalgias and neck pain.  Skin: Negative for rash.  Allergic/Immunologic: Negative for environmental allergies.  Neurological: Negative for dizziness and headaches.  Hematological: Does not bruise/bleed easily.  Psychiatric/Behavioral: Negative for suicidal ideas. The patient is not nervous/anxious.     Patient Active Problem List   Diagnosis Date Noted  . Essential hypertension 10/13/2014  . Esophageal reflux 10/13/2014    No Known Allergies  No past surgical history on file.  Social History   Tobacco Use  . Smoking status: Current Every Day Smoker    Packs/day: 1.00    Years: 36.00    Pack years: 36.00    Types: Cigarettes  . Smokeless tobacco: Former Clinical biochemist  . Vaping Use: Never used  Substance Use Topics  . Alcohol use: Yes    Alcohol/week: 0.0 standard drinks    Comment: pt states a 6 pack a day  . Drug use: No     Medication list has been reviewed and updated.  Current Meds  Medication Sig  . amLODipine (NORVASC) 10 MG tablet TAKE 1 TABLET (10 MG TOTAL) BY MOUTH DAILY AT 6 (SIX) AM.  . aspirin EC 81 MG tablet Take 81 mg by mouth daily at 6 (six) AM.  .  atorvastatin (LIPITOR) 10 MG tablet Take 1 tablet (10 mg total) by mouth daily.  . hydrochlorothiazide (HYDRODIURIL) 12.5 MG tablet Take 1 tablet (12.5 mg total) by mouth daily.  . metoprolol succinate (TOPROL-XL) 100 MG 24 hr tablet Take 1 tablet (100 mg total) by mouth daily. Take with or immediately following a meal.  . pantoprazole (PROTONIX) 40 MG tablet Take 1 tablet (40 mg total) by mouth daily.    PHQ 2/9 Scores 04/16/2020 02/15/2019 08/12/2018 07/09/2018  PHQ -  2 Score 0 0 0 0  PHQ- 9 Score 1 0 3 0    GAD 7 : Generalized Anxiety Score 04/16/2020 02/15/2019  Nervous, Anxious, on Edge 0 0  Control/stop worrying 0 0  Worry too much - different things 0 0  Trouble relaxing 0 0  Restless 0 0  Easily annoyed or irritable 0 0  Afraid - awful might happen 0 0  Total GAD 7 Score 0 0    BP Readings from Last 3 Encounters:  05/16/20 138/80  04/16/20 (!) 152/100  02/15/19 132/80    Physical Exam Vitals and nursing note reviewed.  HENT:     Head: Normocephalic.     Right Ear: Tympanic membrane, ear canal and external ear normal.     Left Ear: Tympanic membrane, ear canal and external ear normal.     Nose: Nose normal.  Eyes:     General: No scleral icterus.       Right eye: No discharge.        Left eye: No discharge.     Conjunctiva/sclera: Conjunctivae normal.     Pupils: Pupils are equal, round, and reactive to light.  Neck:     Thyroid: No thyromegaly.     Vascular: No JVD.     Trachea: No tracheal deviation.  Cardiovascular:     Rate and Rhythm: Normal rate and regular rhythm.     Heart sounds: Normal heart sounds. No murmur heard. No friction rub. No gallop.   Pulmonary:     Effort: No respiratory distress.     Breath sounds: Normal breath sounds. No wheezing or rales.  Abdominal:     General: Bowel sounds are normal.     Palpations: Abdomen is soft. There is no mass.     Tenderness: There is no abdominal tenderness. There is no guarding or rebound.  Musculoskeletal:        General: No tenderness. Normal range of motion.     Cervical back: Normal range of motion and neck supple.  Lymphadenopathy:     Cervical: No cervical adenopathy.  Skin:    General: Skin is warm.     Findings: No rash.  Neurological:     Mental Status: He is alert and oriented to person, place, and time.     Cranial Nerves: No cranial nerve deficit.     Deep Tendon Reflexes: Reflexes are normal and symmetric.     Wt Readings from Last 3 Encounters:   05/16/20 228 lb (103.4 kg)  04/16/20 223 lb (101.2 kg)  02/15/19 233 lb (105.7 kg)    BP 138/80   Pulse 68   Ht 6' (1.829 m)   Wt 228 lb (103.4 kg)   BMI 30.92 kg/m   Assessment and Plan:  1. Essential hypertension Chronic.  Controlled.  Stable.  Blood pressure today is 138 over 80.  Patient will continue hydrochlorothiazide 12.5 mg once a day and metoprolol XL 100 mg once a day and amlodipine 10 mg once  a day.  Will check renal function panel. - hydrochlorothiazide (HYDRODIURIL) 12.5 MG tablet; Take 1 tablet (12.5 mg total) by mouth daily.  Dispense: 90 tablet; Refill: 1 - metoprolol succinate (TOPROL-XL) 100 MG 24 hr tablet; Take 1 tablet (100 mg total) by mouth daily. Take with or immediately following a meal.  Dispense: 90 tablet; Refill: 1 - amLODipine (NORVASC) 10 MG tablet; TAKE 1 TABLET (10 MG TOTAL) BY MOUTH DAILY AT 6 (SIX) AM.  Dispense: 90 tablet; Refill: 1 - Renal Function Panel  2. Familial hypercholesterolemia Chronic.  Controlled.  Stable.  Continue atorvastatin 10 mg once a day.  Will check lipid panel. - atorvastatin (LIPITOR) 10 MG tablet; Take 1 tablet (10 mg total) by mouth daily.  Dispense: 90 tablet; Refill: 1 - Lipid Panel With LDL/HDL Ratio  3. Gastroesophageal reflux disease, unspecified whether esophagitis present Chronic.  Controlled.  Stable.  Continue 40 mg Protonix once a day.  Will recheck in 6 months - pantoprazole (PROTONIX) 40 MG tablet; Take 1 tablet (40 mg total) by mouth daily.  Dispense: 90 tablet; Refill: 1

## 2020-05-17 LAB — RENAL FUNCTION PANEL
Albumin: 4.5 g/dL (ref 3.8–4.9)
BUN/Creatinine Ratio: 11 (ref 9–20)
BUN: 12 mg/dL (ref 6–24)
CO2: 22 mmol/L (ref 20–29)
Calcium: 9.5 mg/dL (ref 8.7–10.2)
Chloride: 96 mmol/L (ref 96–106)
Creatinine, Ser: 1.13 mg/dL (ref 0.76–1.27)
Glucose: 90 mg/dL (ref 65–99)
Phosphorus: 4.4 mg/dL — ABNORMAL HIGH (ref 2.8–4.1)
Potassium: 4.2 mmol/L (ref 3.5–5.2)
Sodium: 138 mmol/L (ref 134–144)
eGFR: 78 mL/min/{1.73_m2} (ref >59)

## 2020-05-17 LAB — LIPID PANEL WITH LDL/HDL RATIO
Cholesterol, Total: 179 mg/dL (ref 100–199)
HDL: 54 mg/dL (ref >39)
LDL Chol Calc (NIH): 90 mg/dL (ref 0–99)
LDL/HDL Ratio: 1.7 ratio (ref 0.0–3.6)
Triglycerides: 208 mg/dL — ABNORMAL HIGH (ref 0–149)
VLDL Cholesterol Cal: 35 mg/dL (ref 5–40)

## 2020-05-24 ENCOUNTER — Encounter: Payer: Self-pay | Admitting: *Deleted

## 2020-05-30 ENCOUNTER — Ambulatory Visit: Payer: BC Managed Care – PPO | Admitting: Family Medicine

## 2020-11-16 ENCOUNTER — Ambulatory Visit (INDEPENDENT_AMBULATORY_CARE_PROVIDER_SITE_OTHER): Payer: BC Managed Care – PPO | Admitting: Family Medicine

## 2020-11-16 ENCOUNTER — Encounter: Payer: Self-pay | Admitting: Family Medicine

## 2020-11-16 ENCOUNTER — Other Ambulatory Visit: Payer: Self-pay

## 2020-11-16 VITALS — BP 138/74 | HR 76 | Ht 72.0 in | Wt 226.0 lb

## 2020-11-16 DIAGNOSIS — E7801 Familial hypercholesterolemia: Secondary | ICD-10-CM | POA: Diagnosis not present

## 2020-11-16 DIAGNOSIS — K219 Gastro-esophageal reflux disease without esophagitis: Secondary | ICD-10-CM | POA: Diagnosis not present

## 2020-11-16 DIAGNOSIS — I10 Essential (primary) hypertension: Secondary | ICD-10-CM

## 2020-11-16 DIAGNOSIS — F17210 Nicotine dependence, cigarettes, uncomplicated: Secondary | ICD-10-CM | POA: Diagnosis not present

## 2020-11-16 MED ORDER — AMLODIPINE BESYLATE 10 MG PO TABS
ORAL_TABLET | ORAL | 1 refills | Status: DC
Start: 2020-11-16 — End: 2021-05-16

## 2020-11-16 MED ORDER — PANTOPRAZOLE SODIUM 40 MG PO TBEC: 40.0000 mg | DELAYED_RELEASE_TABLET | Freq: Every day | ORAL | 1 refills | Status: DC

## 2020-11-16 MED ORDER — METOPROLOL SUCCINATE ER 100 MG PO TB24: 100.0000 mg | ORAL_TABLET | Freq: Every day | ORAL | 1 refills | Status: DC

## 2020-11-16 MED ORDER — ATORVASTATIN CALCIUM 10 MG PO TABS: 10.0000 mg | ORAL_TABLET | Freq: Every day | ORAL | 1 refills | Status: DC

## 2020-11-16 MED ORDER — HYDROCHLOROTHIAZIDE 12.5 MG PO TABS: 12.5000 mg | ORAL_TABLET | Freq: Every day | ORAL | 1 refills | Status: DC

## 2020-11-16 NOTE — Patient Instructions (Signed)

## 2020-11-16 NOTE — Progress Notes (Signed)
Date:  11/16/2020   Name:  Calvin Lynch   DOB:  1965-07-15   MRN:  [id number]   Chief Complaint: Hypertension and Gastroesophageal Reflux  Hypertension This is a chronic problem. The current episode started more than 1 year ago. The problem has been gradually improving since onset. The problem is controlled. Associated symptoms include chest pain. Pertinent negatives include no anxiety, blurred vision, headaches, malaise/fatigue, neck pain, orthopnea, palpitations, peripheral edema, shortness of breath or sweats. Risk factors for coronary artery disease include dyslipidemia, male gender, obesity and smoking/tobacco exposure. Past treatments include calcium channel blockers, beta blockers and diuretics. The current treatment provides moderate improvement. Compliance problems include exercise, diet and psychosocial issues.  Hypertensive end-organ damage includes CAD/MI. There is no history of angina, kidney disease, CVA, heart failure, left ventricular hypertrophy, PVD or retinopathy. There is no history of chronic renal disease, a hypertension causing med or renovascular disease.  Gastroesophageal Reflux He complains of chest pain. He reports no abdominal pain, no belching, no choking, no coughing, no dysphagia, no globus sensation, no heartburn, no hoarse voice, no nausea, no sore throat or no wheezing. This is a chronic problem. The current episode started more than 1 year ago. The problem occurs rarely. The problem has been resolved. Nothing aggravates the symptoms. Pertinent negatives include no anemia, fatigue, muscle weakness or weight loss. Risk factors include smoking/tobacco exposure, obesity, lack of exercise, ETOH use and caffeine use. He has tried a PPI for the symptoms. The treatment provided significant relief.  Hyperlipidemia This is a chronic problem. The current episode started more than 1 year ago. The problem is controlled. Recent lipid tests were reviewed and are normal. He has  no history of chronic renal disease, diabetes, hypothyroidism, liver disease, obesity or nephrotic syndrome. Associated symptoms include chest pain. Pertinent negatives include no focal sensory loss, focal weakness, leg pain, myalgias or shortness of breath. Current antihyperlipidemic treatment includes statins. The current treatment provides moderate improvement of lipids. There are no compliance problems.  Risk factors for coronary artery disease include hypertension.   Lab Results  Component Value Date   CREATININE 1.13 05/16/2020   BUN 12 05/16/2020   NA 138 05/16/2020   K 4.2 05/16/2020   CL 96 05/16/2020   CO2 22 05/16/2020   Lab Results  Component Value Date   CHOL 179 05/16/2020   HDL 54 05/16/2020   LDLCALC 90 05/16/2020   TRIG 208 (H) 05/16/2020   CHOLHDL 3.6 02/15/2019   No results found for: TSH No results found for: HGBA1C Lab Results  Component Value Date   WBC 6.9 10/13/2014   HGB 15.0 10/13/2014   HCT 45.2 10/13/2014   MCV 95.1 10/13/2014   PLT 280 10/13/2014   Lab Results  Component Value Date   ALT 32 03/29/2019   AST 35 03/29/2019   ALKPHOS 73 03/29/2019   BILITOT 0.6 03/29/2019     Review of Systems  Constitutional:  Negative for chills, fatigue, fever, malaise/fatigue and weight loss.  HENT:  Negative for drooling, ear discharge, ear pain, hoarse voice and sore throat.   Eyes:  Negative for blurred vision.  Respiratory:  Negative for cough, choking, shortness of breath and wheezing.   Cardiovascular:  Positive for chest pain. Negative for palpitations, orthopnea and leg swelling.  Gastrointestinal:  Negative for abdominal pain, blood in stool, constipation, diarrhea, dysphagia, heartburn and nausea.  Endocrine: Negative for polydipsia.  Genitourinary:  Negative for dysuria, frequency, hematuria and urgency.  Musculoskeletal:  Negative for back pain, myalgias, muscle weakness and neck pain.  Skin:  Negative for rash.  Allergic/Immunologic: Negative  for environmental allergies.  Neurological:  Negative for dizziness, focal weakness and headaches.  Hematological:  Does not bruise/bleed easily.  Psychiatric/Behavioral:  Negative for suicidal ideas. The patient is not nervous/anxious.    Patient Active Problem List   Diagnosis Date Noted   Essential hypertension 10/13/2014   Esophageal reflux 10/13/2014    No Known Allergies  No past surgical history on file.  Social History   Tobacco Use   Smoking status: Every Day    Packs/day: 1.00    Years: 36.00    Pack years: 36.00    Types: Cigarettes   Smokeless tobacco: Former  Building services engineer Use: Never used  Substance Use Topics   Alcohol use: Yes    Alcohol/week: 0.0 standard drinks    Comment: pt states a 6 pack a day   Drug use: No     Medication list has been reviewed and updated.  Current Meds  Medication Sig   amLODipine (NORVASC) 10 MG tablet TAKE 1 TABLET (10 MG TOTAL) BY MOUTH DAILY AT 6 (SIX) AM.   aspirin EC 81 MG tablet Take 81 mg by mouth daily at 6 (six) AM.   atorvastatin (LIPITOR) 10 MG tablet Take 1 tablet (10 mg total) by mouth daily.   hydrochlorothiazide (HYDRODIURIL) 12.5 MG tablet Take 1 tablet (12.5 mg total) by mouth daily.   metoprolol succinate (TOPROL-XL) 100 MG 24 hr tablet Take 1 tablet (100 mg total) by mouth daily. Take with or immediately following a meal.   pantoprazole (PROTONIX) 40 MG tablet Take 1 tablet (40 mg total) by mouth daily.    PHQ 2/9 Scores 11/16/2020 04/16/2020 02/15/2019 08/12/2018  PHQ - 2 Score 0 0 0 0  PHQ- 9 Score 0 1 0 3    GAD 7 : Generalized Anxiety Score 11/16/2020 04/16/2020 02/15/2019  Nervous, Anxious, on Edge 0 0 0  Control/stop worrying 0 0 0  Worry too much - different things 0 0 0  Trouble relaxing 0 0 0  Restless 0 0 0  Easily annoyed or irritable 0 0 0  Afraid - awful might happen 0 0 0  Total GAD 7 Score 0 0 0    BP Readings from Last 3 Encounters:  11/16/20 138/74  05/16/20 138/80  04/16/20  (!) 152/100    Physical Exam Vitals and nursing note reviewed.  HENT:     Head: Normocephalic.     Right Ear: Tympanic membrane, ear canal and external ear normal.     Left Ear: Tympanic membrane, ear canal and external ear normal.     Nose: Nose normal. No congestion or rhinorrhea.  Eyes:     General: No scleral icterus.       Right eye: No discharge.        Left eye: No discharge.     Conjunctiva/sclera: Conjunctivae normal.     Pupils: Pupils are equal, round, and reactive to light.  Neck:     Thyroid: No thyromegaly.     Vascular: No JVD.     Trachea: No tracheal deviation.  Cardiovascular:     Rate and Rhythm: Normal rate and regular rhythm.     Heart sounds: Normal heart sounds. No murmur heard.   No friction rub. No gallop.  Pulmonary:     Effort: No respiratory distress.     Breath sounds: Normal breath sounds. No wheezing,  rhonchi or rales.  Abdominal:     General: Bowel sounds are normal.     Palpations: Abdomen is soft. There is no mass.     Tenderness: There is no abdominal tenderness. There is no guarding or rebound.  Musculoskeletal:        General: No tenderness. Normal range of motion.     Cervical back: Normal range of motion and neck supple.  Lymphadenopathy:     Cervical: No cervical adenopathy.  Skin:    General: Skin is warm.     Findings: No rash.  Neurological:     Mental Status: He is alert and oriented to person, place, and time.     Cranial Nerves: No cranial nerve deficit.     Deep Tendon Reflexes: Reflexes are normal and symmetric.    Wt Readings from Last 3 Encounters:  11/16/20 226 lb (102.5 kg)  05/16/20 228 lb (103.4 kg)  05/16/20 228 lb (103.4 kg)    BP 138/74   Pulse 76   Ht 6' (1.829 m)   Wt 226 lb (102.5 kg)   BMI 30.65 kg/m   Assessment and Plan: 1. Essential hypertension Chronic.  Controlled.  Stable.  Blood pressure noted today to be 138/74.  Continue amlodipine 10 mg once a day, hydrochlorothiazide 12.5 mg once a day,  and metoprolol XL 100 mg once a day.  Will check CMP for electrolytes and GFR. - amLODipine (NORVASC) 10 MG tablet; TAKE 1 TABLET (10 MG TOTAL) BY MOUTH DAILY AT 6 (SIX) AM.  Dispense: 90 tablet; Refill: 1 - hydrochlorothiazide (HYDRODIURIL) 12.5 MG tablet; Take 1 tablet (12.5 mg total) by mouth daily.  Dispense: 90 tablet; Refill: 1 - metoprolol succinate (TOPROL-XL) 100 MG 24 hr tablet; Take 1 tablet (100 mg total) by mouth daily. Take with or immediately following a meal.  Dispense: 90 tablet; Refill: 1 - Comprehensive Metabolic Panel (CMET)  2. Familial hypercholesterolemia Chronic.  Controlled.  Stable.  Continue atorvastatin 10 mg once a day.  Will check lipid panel. - atorvastatin (LIPITOR) 10 MG tablet; Take 1 tablet (10 mg total) by mouth daily.  Dispense: 90 tablet; Refill: 1 - Lipid Panel With LDL/HDL Ratio  3. Gastroesophageal reflux disease, unspecified whether esophagitis present Chronic.  Controlled.  Stable.  Continue pantoprazole 40 mg once a day. - pantoprazole (PROTONIX) 40 MG tablet; Take 1 tablet (40 mg total) by mouth daily.  Dispense: 90 tablet; Refill: 1  4. Cigarette nicotine dependence without complication Patient has been advised of the health risks of smoking and counseled concerning cessation of tobacco products. I spent over 3 minutes for discussion and to answer questions.

## 2020-11-17 LAB — COMPREHENSIVE METABOLIC PANEL WITH GFR
ALT: 28 [IU]/L (ref 0–44)
AST: 31 [IU]/L (ref 0–40)
Albumin/Globulin Ratio: 1.8 (ref 1.2–2.2)
Albumin: 4.5 g/dL (ref 3.8–4.9)
Alkaline Phosphatase: 72 [IU]/L (ref 44–121)
BUN/Creatinine Ratio: 9 (ref 9–20)
BUN: 10 mg/dL (ref 6–24)
Bilirubin Total: 0.6 mg/dL (ref 0.0–1.2)
CO2: 23 mmol/L (ref 20–29)
Calcium: 9.3 mg/dL (ref 8.7–10.2)
Chloride: 100 mmol/L (ref 96–106)
Creatinine, Ser: 1.15 mg/dL (ref 0.76–1.27)
Globulin, Total: 2.5 g/dL (ref 1.5–4.5)
Glucose: 95 mg/dL (ref 70–99)
Potassium: 4.2 mmol/L (ref 3.5–5.2)
Sodium: 140 mmol/L (ref 134–144)
Total Protein: 7 g/dL (ref 6.0–8.5)
eGFR: 76 mL/min/{1.73_m2}

## 2020-11-17 LAB — LIPID PANEL WITH LDL/HDL RATIO
Cholesterol, Total: 168 mg/dL (ref 100–199)
HDL: 64 mg/dL
LDL Chol Calc (NIH): 72 mg/dL (ref 0–99)
LDL/HDL Ratio: 1.1 ratio (ref 0.0–3.6)
Triglycerides: 197 mg/dL — ABNORMAL HIGH (ref 0–149)
VLDL Cholesterol Cal: 32 mg/dL (ref 5–40)

## 2021-02-22 ENCOUNTER — Encounter: Payer: Self-pay | Admitting: Family Medicine

## 2021-05-16 ENCOUNTER — Encounter: Payer: Self-pay | Admitting: Family Medicine

## 2021-05-16 ENCOUNTER — Ambulatory Visit (INDEPENDENT_AMBULATORY_CARE_PROVIDER_SITE_OTHER): Payer: BC Managed Care – PPO | Admitting: Family Medicine

## 2021-05-16 VITALS — BP 120/64 | HR 80 | Ht 72.0 in | Wt 238.0 lb

## 2021-05-16 DIAGNOSIS — E7801 Familial hypercholesterolemia: Secondary | ICD-10-CM | POA: Diagnosis not present

## 2021-05-16 DIAGNOSIS — I10 Essential (primary) hypertension: Secondary | ICD-10-CM | POA: Diagnosis not present

## 2021-05-16 DIAGNOSIS — K219 Gastro-esophageal reflux disease without esophagitis: Secondary | ICD-10-CM

## 2021-05-16 MED ORDER — PANTOPRAZOLE SODIUM 40 MG PO TBEC: 40.0000 mg | DELAYED_RELEASE_TABLET | Freq: Every day | ORAL | 1 refills | Status: DC

## 2021-05-16 MED ORDER — METOPROLOL SUCCINATE ER 100 MG PO TB24: 100.0000 mg | ORAL_TABLET | Freq: Every day | ORAL | 1 refills | Status: DC

## 2021-05-16 MED ORDER — AMLODIPINE BESYLATE 10 MG PO TABS: ORAL_TABLET | ORAL | 1 refills | Status: DC

## 2021-05-16 MED ORDER — HYDROCHLOROTHIAZIDE 12.5 MG PO TABS: 12.5000 mg | ORAL_TABLET | Freq: Every day | ORAL | 1 refills | Status: DC

## 2021-05-16 MED ORDER — ATORVASTATIN CALCIUM 10 MG PO TABS: 10.0000 mg | ORAL_TABLET | Freq: Every day | ORAL | 1 refills | Status: DC

## 2021-05-16 NOTE — Progress Notes (Signed)
? ? ?Date:  05/16/2021  ? ?Name:  Calvin Lynch   DOB:  [birth year]   MRN:  [id number] ? ? ?Chief Complaint: Gastroesophageal Reflux, Hypertension, and Hyperlipidemia ? ?Gastroesophageal Reflux ?He reports no abdominal pain, no belching, no chest pain, no choking, no coughing, no dysphagia, no early satiety, no heartburn, no hoarse voice, no nausea, no sore throat or no wheezing. This is a recurrent problem. The current episode started more than 1 year ago. The problem occurs occasionally. The problem has been gradually improving. Nothing aggravates the symptoms. Pertinent negatives include no anemia, fatigue, melena, muscle weakness, orthopnea or weight loss. There are no known risk factors. He has tried a PPI for the symptoms. The treatment provided moderate relief.  ?Hypertension ?This is a chronic problem. The problem has been gradually improving since onset. The problem is controlled. Pertinent negatives include no chest pain, headaches, neck pain, palpitations or shortness of breath. Past treatments include beta blockers, calcium channel blockers and diuretics. The current treatment provides moderate improvement. There are no compliance problems.  There is no history of angina, kidney disease, CAD/MI, CVA, heart failure, left ventricular hypertrophy, PVD or retinopathy. There is no history of chronic renal disease, a hypertension causing med or renovascular disease.  ?Hyperlipidemia ?This is a chronic problem. The current episode started more than 1 year ago. The problem is controlled. Recent lipid tests were reviewed and are normal. He has no history of chronic renal disease, diabetes, hypothyroidism, liver disease, obesity or nephrotic syndrome. Factors aggravating his hyperlipidemia include thiazides. Pertinent negatives include no chest pain, focal sensory loss, focal weakness, leg pain, myalgias or shortness of breath. Current antihyperlipidemic treatment includes statins. The current treatment provides  moderate improvement of lipids. There are no compliance problems.  There are no known risk factors for coronary artery disease.  ? ?Lab Results  ?Component Value Date  ? NA 140 11/16/2020  ? K 4.2 11/16/2020  ? CO2 23 11/16/2020  ? GLUCOSE 95 11/16/2020  ? BUN 10 11/16/2020  ? CREATININE 1.15 11/16/2020  ? CALCIUM 9.3 11/16/2020  ? EGFR 76 11/16/2020  ? GFRNONAA 66 07/09/2018  ? ?Lab Results  ?Component Value Date  ? CHOL 168 11/16/2020  ? HDL 64 11/16/2020  ? Williams 72 11/16/2020  ? TRIG 197 (H) 11/16/2020  ? CHOLHDL 3.6 02/15/2019  ? ?No results found for: TSH ?No results found for: HGBA1C ?Lab Results  ?Component Value Date  ? WBC 6.9 10/13/2014  ? HGB 15.0 10/13/2014  ? HCT 45.2 10/13/2014  ? MCV 95.1 10/13/2014  ? PLT 280 10/13/2014  ? ?Lab Results  ?Component Value Date  ? ALT 28 11/16/2020  ? AST 31 11/16/2020  ? ALKPHOS 72 11/16/2020  ? BILITOT 0.6 11/16/2020  ? ?No results found for: 25OHVITD2, [place], VD25OH  ? ?Review of Systems  ?Constitutional:  Negative for chills, fatigue, fever and weight loss.  ?HENT:  Negative for drooling, ear discharge, ear pain, hoarse voice and sore throat.   ?Respiratory:  Negative for cough, choking, shortness of breath and wheezing.   ?Cardiovascular:  Negative for chest pain, palpitations and leg swelling.  ?Gastrointestinal:  Negative for abdominal pain, blood in stool, constipation, diarrhea, dysphagia, heartburn, melena and nausea.  ?Endocrine: Negative for polydipsia.  ?Genitourinary:  Negative for dysuria, frequency, hematuria and urgency.  ?Musculoskeletal:  Negative for back pain, myalgias, muscle weakness and neck pain.  ?Skin:  Negative for rash.  ?Allergic/Immunologic: Negative for environmental allergies.  ?Neurological:  Negative for dizziness, focal weakness  and headaches.  ?Hematological:  Does not bruise/bleed easily.  ?Psychiatric/Behavioral:  Negative for suicidal ideas. The patient is not nervous/anxious.   ? ?Patient Active Problem List  ? Diagnosis  Date Noted  ? Essential hypertension 10/13/2014  ? Esophageal reflux 10/13/2014  ? ? ?No Known Allergies ? ?No past surgical history on file. ? ?Social History  ? ?Tobacco Use  ? Smoking status: Every Day  ?  Packs/day: 1.00  ?  Years: 36.00  ?  Pack years: 36.00  ?  Types: Cigarettes  ? Smokeless tobacco: Former  ?Vaping Use  ? Vaping Use: Never used  ?Substance Use Topics  ? Alcohol use: Yes  ?  Alcohol/week: 0.0 standard drinks  ?  Comment: pt states a 6 pack a day  ? Drug use: No  ? ? ? ?Medication list has been reviewed and updated. ? ?Current Meds  ?Medication Sig  ? amLODipine (NORVASC) 10 MG tablet TAKE 1 TABLET (10 MG TOTAL) BY MOUTH DAILY AT 6 (SIX) AM.  ? aspirin EC 81 MG tablet Take 81 mg by mouth daily at 6 (six) AM.  ? atorvastatin (LIPITOR) 10 MG tablet Take 1 tablet (10 mg total) by mouth daily.  ? hydrochlorothiazide (HYDRODIURIL) 12.5 MG tablet Take 1 tablet (12.5 mg total) by mouth daily.  ? metoprolol succinate (TOPROL-XL) 100 MG 24 hr tablet Take 1 tablet (100 mg total) by mouth daily. Take with or immediately following a meal.  ? pantoprazole (PROTONIX) 40 MG tablet Take 1 tablet (40 mg total) by mouth daily.  ? ? ? ?  05/16/2021  ?  9:10 AM 11/16/2020  ?  8:41 AM 04/16/2020  ?  9:24 AM 02/15/2019  ? 10:14 AM  ?GAD 7 : Generalized Anxiety Score  ?Nervous, Anxious, on Edge 0 0 0 0  ?Control/stop worrying 0 0 0 0  ?Worry too much - different things 0 0 0 0  ?Trouble relaxing 0 0 0 0  ?Restless 0 0 0 0  ?Easily annoyed or irritable 0 0 0 0  ?Afraid - awful might happen 0 0 0 0  ?Total GAD 7 Score 0 0 0 0  ?Anxiety Difficulty Not difficult at all     ? ? ? ?  05/16/2021  ?  9:10 AM  ?Depression screen PHQ 2/9  ?Decreased Interest 0  ?Down, Depressed, Hopeless 0  ?PHQ - 2 Score 0  ?Altered sleeping 0  ?Tired, decreased energy 0  ?Change in appetite 0  ?Feeling bad or failure about yourself  0  ?Trouble concentrating 0  ?Moving slowly or fidgety/restless 0  ?Suicidal thoughts 0  ?PHQ-9 Score 0   ?Difficult doing work/chores Not difficult at all  ? ? ?BP Readings from Last 3 Encounters:  ?05/16/21 120/64  ?11/16/20 138/74  ?05/16/20 138/80  ? ? ?Physical Exam ?Vitals and nursing note reviewed.  ?HENT:  ?   Head: Normocephalic.  ?   Right Ear: Tympanic membrane, ear canal and external ear normal. There is no impacted cerumen.  ?   Left Ear: Tympanic membrane, ear canal and external ear normal. There is no impacted cerumen.  ?   Nose: Nose normal. No congestion or rhinorrhea.  ?Eyes:  ?   General: No scleral icterus.    ?   Right eye: No discharge.     ?   Left eye: No discharge.  ?   Conjunctiva/sclera: Conjunctivae normal.  ?   Pupils: Pupils are equal, round, and reactive to light.  ?Neck:  ?  Thyroid: No thyromegaly.  ?   Vascular: No JVD.  ?   Trachea: No tracheal deviation.  ?Cardiovascular:  ?   Rate and Rhythm: Normal rate and regular rhythm.  ?   Heart sounds: Normal heart sounds. No murmur heard. ?  No friction rub. No gallop.  ?Pulmonary:  ?   Effort: No respiratory distress.  ?   Breath sounds: Normal breath sounds. No wheezing, rhonchi or rales.  ?Abdominal:  ?   General: Bowel sounds are normal.  ?   Palpations: Abdomen is soft. There is no mass.  ?   Tenderness: There is no abdominal tenderness. There is no guarding or rebound.  ?Musculoskeletal:     ?   General: No tenderness. Normal range of motion.  ?   Cervical back: Normal range of motion and neck supple.  ?Lymphadenopathy:  ?   Cervical: No cervical adenopathy.  ?Skin: ?   General: Skin is warm.  ?   Findings: No rash.  ?Neurological:  ?   Mental Status: He is alert and oriented to person, place, and time.  ?   Cranial Nerves: No cranial nerve deficit.  ?   Deep Tendon Reflexes: Reflexes are normal and symmetric.  ? ? ?Wt Readings from Last 3 Encounters:  ?05/16/21 238 lb (108 kg)  ?11/16/20 226 lb (102.5 kg)  ?05/16/20 228 lb (103.4 kg)  ? ? ?BP 120/64   Pulse 80   Ht 6' (1.829 m)   Wt 238 lb (108 kg)   BMI 32.28 kg/m?   ? ?Assessment and Plan: ? ?1. Essential hypertension ?Chronic.  Controlled.  Stable.  Blood pressure is 120/64.  Continue amlodipine 10 mg once a day, metoprolol XL 100 mg once a day and hydrochlorothiazide 12.5 mg once a day. ?-

## 2021-06-10 ENCOUNTER — Telehealth: Payer: Self-pay | Admitting: *Deleted

## 2021-06-10 NOTE — Telephone Encounter (Signed)
LMTC to schedule Yearly Lung CA CT Scan. 

## 2021-06-14 ENCOUNTER — Other Ambulatory Visit: Payer: Self-pay

## 2021-06-14 DIAGNOSIS — Z122 Encounter for screening for malignant neoplasm of respiratory organs: Secondary | ICD-10-CM

## 2021-06-14 DIAGNOSIS — Z87891 Personal history of nicotine dependence: Secondary | ICD-10-CM

## 2021-06-14 DIAGNOSIS — F17210 Nicotine dependence, cigarettes, uncomplicated: Secondary | ICD-10-CM

## 2021-06-14 NOTE — Telephone Encounter (Signed)
Received VM from patient to schedule LDCT.  Called back but no answer. VM left to call us ?

## 2021-06-26 ENCOUNTER — Ambulatory Visit
Admission: RE | Admit: 2021-06-26 | Discharge: 2021-06-26 | Disposition: A | Discharge status: Home / Self Care | Payer: BC Managed Care – PPO | Source: Ambulatory Visit | Attending: Acute Care | Admitting: Acute Care

## 2021-06-26 DIAGNOSIS — J439 Emphysema, unspecified: Secondary | ICD-10-CM | POA: Diagnosis not present

## 2021-06-26 DIAGNOSIS — I700 Atherosclerosis of aorta: Secondary | ICD-10-CM | POA: Insufficient documentation

## 2021-06-26 DIAGNOSIS — F17210 Nicotine dependence, cigarettes, uncomplicated: Secondary | ICD-10-CM | POA: Diagnosis not present

## 2021-06-26 DIAGNOSIS — Z122 Encounter for screening for malignant neoplasm of respiratory organs: Secondary | ICD-10-CM | POA: Insufficient documentation

## 2021-06-26 DIAGNOSIS — Z87891 Personal history of nicotine dependence: Secondary | ICD-10-CM

## 2021-06-26 DIAGNOSIS — I2510 Atherosclerotic heart disease of native coronary artery without angina pectoris: Secondary | ICD-10-CM | POA: Diagnosis not present

## 2021-06-26 DIAGNOSIS — I10 Essential (primary) hypertension: Secondary | ICD-10-CM | POA: Diagnosis not present

## 2021-06-28 ENCOUNTER — Other Ambulatory Visit: Payer: Self-pay

## 2021-06-28 DIAGNOSIS — F17210 Nicotine dependence, cigarettes, uncomplicated: Secondary | ICD-10-CM

## 2021-06-28 DIAGNOSIS — Z122 Encounter for screening for malignant neoplasm of respiratory organs: Secondary | ICD-10-CM

## 2021-06-28 DIAGNOSIS — Z87891 Personal history of nicotine dependence: Secondary | ICD-10-CM

## 2021-08-07 DIAGNOSIS — Z86010 Personal history of colonic polyps: Secondary | ICD-10-CM | POA: Diagnosis not present

## 2021-08-07 DIAGNOSIS — K5730 Diverticulosis of large intestine without perforation or abscess without bleeding: Secondary | ICD-10-CM | POA: Diagnosis not present

## 2021-08-19 ENCOUNTER — Encounter: Payer: Self-pay | Admitting: Family Medicine

## 2021-08-21 ENCOUNTER — Ambulatory Visit
Admission: RE | Admit: 2021-08-21 | Discharge: 2021-08-21 | Disposition: A | Discharge status: Home / Self Care | Payer: BC Managed Care – PPO | Attending: Family Medicine | Admitting: Family Medicine

## 2021-08-21 ENCOUNTER — Encounter: Payer: Self-pay | Admitting: Family Medicine

## 2021-08-21 ENCOUNTER — Ambulatory Visit
Admission: RE | Admit: 2021-08-21 | Discharge: 2021-08-21 | Disposition: A | Discharge status: Home / Self Care | Payer: BC Managed Care – PPO | Source: Ambulatory Visit | Attending: Family Medicine | Admitting: Family Medicine

## 2021-08-21 ENCOUNTER — Ambulatory Visit (INDEPENDENT_AMBULATORY_CARE_PROVIDER_SITE_OTHER): Payer: BC Managed Care – PPO | Admitting: Family Medicine

## 2021-08-21 VITALS — BP 150/90 | HR 108 | Ht 72.0 in | Wt 239.6 lb

## 2021-08-21 DIAGNOSIS — M1712 Unilateral primary osteoarthritis, left knee: Secondary | ICD-10-CM | POA: Diagnosis not present

## 2021-08-21 DIAGNOSIS — M25562 Pain in left knee: Secondary | ICD-10-CM | POA: Diagnosis not present

## 2021-08-21 MED ORDER — MELOXICAM 15 MG PO TABS: 15.0000 mg | ORAL_TABLET | Freq: Every day | ORAL | 0 refills | Status: DC

## 2021-08-21 NOTE — Progress Notes (Signed)
     Primary Care / Sports Medicine Office Visit  Patient Information:  Patient ID: Calvin Lynch, male DOB: 06/01/1965 Age: 56 y.o. MRN: [id number]   Calvin Lynch is a pleasant 56 y.o. male presenting with the following:  Chief Complaint  Patient presents with   Leg Pain    Woke up Saturday morning with left leg/knee pain and swelling, No HX of gout, No known injury.     Vitals:   08/21/21 0826  BP: (!) 150/90  Pulse: (!) 108  SpO2: 95%   Vitals:   08/21/21 0826  Weight: 239 lb 9.6 oz (108.7 kg)  Height: 6' (1.829 m)   Body mass index is 32.5 kg/m.     Independent interpretation of notes and tests performed by another provider:   None  Procedures performed:   None  Pertinent History, Exam, Impression, and Recommendations:   Problem List Items Addressed This Visit       Other   Arthralgia of knee, left - Primary    Acute on chronic issue involving the left lateral knee with most recent episode ongoing over the past few days.  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  Pain to the lateral left knee without radiation, no swelling, no mechanical symptoms described.  Examination reveals no effusion, pain with repetitive passive flexion and extension localizing laterally, crepitus appreciated, maximal tenderness at the fibular head and lateral joint line, secondarily to the patellar facets and medial joint line, no laxity with anterior/posterior drawer, valgus stressing benign, varus stressing without laxity but does elicit pain laterally, McMurray's localizes laterally.  Given his stated symptomatology, history of the same, concern for osteoarthritis at the lateral tibiofemoral and/or tibiofibular articulations, additional concern for lateral meniscal derangement.  Plan for dedicated x-rays of the left knee, out of work restrictions, scheduled meloxicam 15 mg, and close  follow-up in 2 weeks.  Pending response and x-ray findings, can dictate next steps.      Relevant Medications   meloxicam (MOBIC) 15 MG tablet   Other Relevant Orders   DG Knee Complete 4 Views Left (Completed)     Orders & Medications Meds ordered this encounter  Medications   meloxicam (MOBIC) 15 MG tablet    Sig: Take 1 tablet (15 mg total) by mouth daily.    Dispense:  14 tablet    Refill:  0   Orders Placed This Encounter  Procedures   DG Knee Complete 4 Views Left     Return in about 2 weeks (around 09/04/2021).     Jerrol Banana, MD   Primary Care Sports Medicine [facility]

## 2021-08-21 NOTE — Assessment & Plan Note (Signed)
Acute on chronic issue involving the left lateral knee with most recent episode ongoing over the past few days.  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  Pain to the lateral left knee without radiation, no swelling, no mechanical symptoms described.  Examination reveals no effusion, pain with repetitive passive flexion and extension localizing laterally, crepitus appreciated, maximal tenderness at the fibular head and lateral joint line, secondarily to the patellar facets and medial joint line, no laxity with anterior/posterior drawer, valgus stressing benign, varus stressing without laxity but does elicit pain laterally, McMurray's localizes laterally.  Given his stated symptomatology, history of the same, concern for osteoarthritis at the lateral tibiofemoral and/or tibiofibular articulations, additional concern for lateral meniscal derangement.  Plan for dedicated x-rays of the left knee, out of work restrictions, scheduled meloxicam 15 mg, and close follow-up in 2 weeks.  Pending response and x-ray findings, can dictate next steps.

## 2021-08-21 NOTE — Patient Instructions (Addendum)
-   Obtain x-rays - Start meloxicam once daily with food x 2 weeks  - Out of work with note - Return in 2 weeks

## 2021-08-23 DIAGNOSIS — K5730 Diverticulosis of large intestine without perforation or abscess without bleeding: Secondary | ICD-10-CM | POA: Diagnosis not present

## 2021-08-23 DIAGNOSIS — Z86010 Personal history of colonic polyps: Secondary | ICD-10-CM | POA: Diagnosis not present

## 2021-08-23 DIAGNOSIS — D123 Benign neoplasm of transverse colon: Secondary | ICD-10-CM | POA: Diagnosis not present

## 2021-08-23 DIAGNOSIS — K640 First degree hemorrhoids: Secondary | ICD-10-CM | POA: Diagnosis not present

## 2021-08-23 DIAGNOSIS — D126 Benign neoplasm of colon, unspecified: Secondary | ICD-10-CM | POA: Diagnosis not present

## 2021-08-23 DIAGNOSIS — Z1211 Encounter for screening for malignant neoplasm of colon: Secondary | ICD-10-CM | POA: Diagnosis not present

## 2021-09-05 ENCOUNTER — Ambulatory Visit (INDEPENDENT_AMBULATORY_CARE_PROVIDER_SITE_OTHER): Payer: BC Managed Care – PPO | Admitting: Family Medicine

## 2021-09-05 ENCOUNTER — Encounter: Payer: Self-pay | Admitting: Family Medicine

## 2021-09-05 DIAGNOSIS — M1712 Unilateral primary osteoarthritis, left knee: Secondary | ICD-10-CM

## 2021-09-05 NOTE — Patient Instructions (Addendum)
-   Start physical therapy, referral coordinator will contact you for scheduling, alternatively can contact number below for expedited scheduling - Transition meloxicam to as needed dosing for pain control on a day-to-day and as part of physical therapy - Return for follow-up in 6 weeks (if you are doing excellent with no symptoms or need for consistent medication, canceled that visit) - Contact for any questions between now and then  [facility]:  [place]:  [phone]

## 2021-09-05 NOTE — Progress Notes (Signed)
     Primary Care / Sports Medicine Office Visit  Patient Information:  Patient ID: TETSUO COPPOLA, male DOB: Feb 24, 1965 Age: 56 y.o. MRN: [id number]   CAIO DEVERA is a pleasant 56 y.o. male presenting with the following:  Chief Complaint  Patient presents with   Arthralgia of knee, left    Medication working, feeling a little better. XRAY follow up    Vitals:   09/05/21 0806  BP: (!) 150/88  Pulse: 64   Vitals:   09/05/21 0806  Weight: 236 lb (107 kg)  Height: 6' (1.829 m)   Body mass index is 32.01 kg/m.  DG Knee Complete 4 Views Left  Result Date: 08/21/2021 CLINICAL DATA:  Lateral knee pain. EXAM: LEFT KNEE - COMPLETE 4+ VIEW COMPARISON:  None Available. FINDINGS: No acute fracture or dislocation. Tricompartmental degenerative changes. Regional soft tissues are unremarkable. Vascular calcifications. IMPRESSION: Tricompartmental degenerative changes. Electronically Signed   By: Annia Belt M.D.   On: 08/21/2021 11:00     Independent interpretation of notes and tests performed by another provider:   Independent interpretation of left knee x-rays dated 08/21/2021 reveal tricompartmental moderate-severe degenerative changes, subtle medial tibiofemoral offset, asymmetric joint space narrowing laterally and at the lateral aspect of the patellofemoral articulation, multiple osteophytes, no acute osseous processes noted.  Procedures performed:   None  Pertinent History, Exam, Impression, and Recommendations:   Problem List Items Addressed This Visit       Musculoskeletal and Integument   Primary osteoarthritis of left knee    Patient presents for follow-up to left knee acute on chronic pain, at last visit we ordered x-rays and he was started on daily meloxicam.  He has noted full symptom resolution, albeit with daily meloxicam.  X-rays do show tricompartmental osteoarthritis with more involved findings at the areas of pain laterally.  At this stage given the chronicity of  symptoms, the highly physical demands of his work, I have advised formal physical therapy, we will seek authorization for viscosupplementation, and a transition of scheduled meloxicam to daily as needed meloxicam.  We will set up a follow-up in 6 weeks to address any persistent symptomatology or need for daily meloxicam, if noted he may benefit from corticosteroid injection.      Relevant Orders   Ambulatory referral to Physical Therapy     Orders & Medications No orders of the defined types were placed in this encounter.  Orders Placed This Encounter  Procedures   Ambulatory referral to Physical Therapy     Return in about 6 weeks (around 10/17/2021).     Jerrol Banana, MD   Primary Care Sports Medicine [facility]

## 2021-09-05 NOTE — Assessment & Plan Note (Signed)
Patient presents for follow-up to left knee acute on chronic pain, at last visit we ordered x-rays and he was started on daily meloxicam.  He has noted full symptom resolution, albeit with daily meloxicam.  X-rays do show tricompartmental osteoarthritis with more involved findings at the areas of pain laterally.  At this stage given the chronicity of symptoms, the highly physical demands of his work, I have advised formal physical therapy, we will seek authorization for viscosupplementation, and a transition of scheduled meloxicam to daily as needed meloxicam.  We will set up a follow-up in 6 weeks to address any persistent symptomatology or need for daily meloxicam, if noted he may benefit from corticosteroid injection.

## 2021-09-30 ENCOUNTER — Encounter: Payer: Self-pay | Admitting: Family Medicine

## 2021-09-30 ENCOUNTER — Ambulatory Visit (INDEPENDENT_AMBULATORY_CARE_PROVIDER_SITE_OTHER): Payer: BC Managed Care – PPO | Admitting: Family Medicine

## 2021-09-30 VITALS — BP 158/92 | HR 80 | Ht 72.0 in | Wt 238.0 lb

## 2021-09-30 DIAGNOSIS — R822 Biliuria: Secondary | ICD-10-CM | POA: Diagnosis not present

## 2021-09-30 DIAGNOSIS — N342 Other urethritis: Secondary | ICD-10-CM | POA: Diagnosis not present

## 2021-09-30 DIAGNOSIS — R310 Gross hematuria: Secondary | ICD-10-CM | POA: Diagnosis not present

## 2021-09-30 LAB — POCT URINALYSIS DIPSTICK
Blood, UA: NEGATIVE
Glucose, UA: NEGATIVE
Ketones, UA: NEGATIVE
Leukocytes, UA: NEGATIVE
Nitrite, UA: NEGATIVE
Protein, UA: POSITIVE — AB
Spec Grav, UA: >=1.03 — AB (ref 1.010–1.025)
Urobilinogen, UA: 0.2 E.U./dL
pH, UA: 6 (ref 5.0–8.0)

## 2021-09-30 MED ORDER — DOXYCYCLINE HYCLATE 100 MG PO TABS: 100.0000 mg | ORAL_TABLET | Freq: Two times a day (BID) | ORAL | 0 refills | Status: DC

## 2021-09-30 NOTE — Progress Notes (Signed)
Date:  09/30/2021   Name:  Calvin Lynch   DOB:  1965-08-11   MRN:  [id number]   Chief Complaint: Hematuria (Looks like blood in urine and lower back pain- started last week)  Dysuria  This is a new problem. The current episode started in the past 7 days. The problem occurs every urination. The problem has been waxing and waning. The quality of the pain is described as burning. Pertinent negatives include no chills, discharge, flank pain, frequency, hematuria, hesitancy, nausea, sweats, urgency or vomiting. He has tried nothing for the symptoms.    Lab Results  Component Value Date   NA 140 11/16/2020   K 4.2 11/16/2020   CO2 23 11/16/2020   GLUCOSE 95 11/16/2020   BUN 10 11/16/2020   CREATININE 1.15 11/16/2020   CALCIUM 9.3 11/16/2020   EGFR 76 11/16/2020   GFRNONAA 66 07/09/2018   Lab Results  Component Value Date   CHOL 168 11/16/2020   HDL 64 11/16/2020   LDLCALC 72 11/16/2020   TRIG 197 (H) 11/16/2020   CHOLHDL 3.6 02/15/2019   No results found for: "TSH" No results found for: "HGBA1C" Lab Results  Component Value Date   WBC 6.9 10/13/2014   HGB 15.0 10/13/2014   HCT 45.2 10/13/2014   MCV 95.1 10/13/2014   PLT 280 10/13/2014   Lab Results  Component Value Date   ALT 28 11/16/2020   AST 31 11/16/2020   ALKPHOS 72 11/16/2020   BILITOT 0.6 11/16/2020   No results found for: "25OHVITD2", "25OHVITD3", "VD25OH"   Review of Systems  Constitutional:  Negative for chills, fatigue, fever and unexpected weight change.  Gastrointestinal:  Negative for nausea and vomiting.  Genitourinary:  Positive for dysuria. Negative for difficulty urinating, flank pain, frequency, hematuria, hesitancy, testicular pain and urgency.    Patient Active Problem List   Diagnosis Date Noted   Primary osteoarthritis of left knee 08/21/2021   Essential hypertension 10/13/2014   Esophageal reflux 10/13/2014    No Known Allergies  No past surgical history on file.  Social  History   Tobacco Use   Smoking status: Every Day    Packs/day: 1.00    Years: 36.00    Total pack years: 36.00    Types: Cigarettes   Smokeless tobacco: Former  Scientific laboratory technician Use: Never used  Substance Use Topics   Alcohol use: Yes    Alcohol/week: 0.0 standard drinks of alcohol    Comment: pt states a 6 pack a day   Drug use: No     Medication list has been reviewed and updated.  Current Meds  Medication Sig   amLODipine (NORVASC) 10 MG tablet TAKE 1 TABLET (10 MG TOTAL) BY MOUTH DAILY AT 6 (SIX) AM.   aspirin EC 81 MG tablet Take 81 mg by mouth daily at 6 (six) AM.   atorvastatin (LIPITOR) 10 MG tablet Take 1 tablet (10 mg total) by mouth daily.   doxycycline (VIBRA-TABS) 100 MG tablet Take 1 tablet (100 mg total) by mouth 2 (two) times daily.   hydrochlorothiazide (HYDRODIURIL) 12.5 MG tablet Take 1 tablet (12.5 mg total) by mouth daily.   meloxicam (MOBIC) 15 MG tablet Take 1 tablet (15 mg total) by mouth daily.   metoprolol succinate (TOPROL-XL) 100 MG 24 hr tablet Take 1 tablet (100 mg total) by mouth daily. Take with or immediately following a meal.   pantoprazole (PROTONIX) 40 MG tablet Take 1 tablet (40 mg total) by  mouth daily.       09/30/2021   11:32 AM 09/05/2021    8:08 AM 05/16/2021    9:10 AM 11/16/2020    8:41 AM  GAD 7 : Generalized Anxiety Score  Nervous, Anxious, on Edge 0 0 0 0  Control/stop worrying 0 0 0 0  Worry too much - different things 0 0 0 0  Trouble relaxing 0 0 0 0  Restless 0 0 0 0  Easily annoyed or irritable 0 0 0 0  Afraid - awful might happen 0 0 0 0  Total GAD 7 Score 0 0 0 0  Anxiety Difficulty Not difficult at all  Not difficult at all        09/30/2021   11:32 AM 09/05/2021    8:08 AM 05/16/2021    9:10 AM  Depression screen PHQ 2/9  Decreased Interest 0 3 0  Down, Depressed, Hopeless 0 0 0  PHQ - 2 Score 0 3 0  Altered sleeping 0 0 0  Tired, decreased energy 0 3 0  Change in appetite 0 0 0  Feeling bad or failure  about yourself  0 0 0  Trouble concentrating 0 0 0  Moving slowly or fidgety/restless 0 0 0  Suicidal thoughts 0 0 0  PHQ-9 Score 0 6 0  Difficult doing work/chores Not difficult at all Not difficult at all Not difficult at all    BP Readings from Last 3 Encounters:  09/30/21 (!) 158/92  09/05/21 (!) 150/88  08/21/21 (!) 150/90    Physical Exam Vitals and nursing note reviewed.  HENT:     Head: Normocephalic.     Right Ear: External ear normal.     Left Ear: External ear normal.     Nose: Nose normal.  Eyes:     General: No scleral icterus.       Right eye: No discharge.        Left eye: No discharge.     Conjunctiva/sclera: Conjunctivae normal.     Pupils: Pupils are equal, round, and reactive to light.  Neck:     Thyroid: No thyromegaly.     Vascular: No JVD.     Trachea: No tracheal deviation.  Cardiovascular:     Rate and Rhythm: Normal rate and regular rhythm.     Heart sounds: Normal heart sounds and S1 normal. No murmur heard.    No friction rub. No gallop.  Pulmonary:     Effort: No respiratory distress.     Breath sounds: Normal breath sounds. No wheezing or rales.  Abdominal:     General: Bowel sounds are normal.     Palpations: Abdomen is soft. There is no hepatomegaly, splenomegaly or mass.     Tenderness: There is no abdominal tenderness. There is no guarding or rebound.  Musculoskeletal:        General: No tenderness. Normal range of motion.     Cervical back: Normal range of motion and neck supple.  Lymphadenopathy:     Cervical: No cervical adenopathy.  Skin:    General: Skin is warm.     Findings: No rash.  Neurological:     Mental Status: He is alert.     Wt Readings from Last 3 Encounters:  09/30/21 238 lb (108 kg)  09/05/21 236 lb (107 kg)  08/21/21 239 lb 9.6 oz (108.7 kg)    BP (!) 158/92   Pulse 80   Ht 6' (1.829 m)   Wt 238 lb (  108 kg)   BMI 32.28 kg/m   Assessment and Plan:  1. Gross hematuria Plan checking urinalysis and  no red cells or white cells discoloration which was positive. - POCT urinalysis dipstick  2. Urethritis Acute.  Persistent.  Stable.  Symptomatic with dysuria.  Concerned that there may be a urethritis and we will treat with doxycycline 100 - doxycycline (VIBRA-TABS) 100 MG tablet; Take 1 tablet (100 mg total) by mouth 2 (two) times daily.  Dispense: 14 tablet; Refill: 0  3. Bilirubin in urine They say its your birthday.  Mr. Vanbenschoten celebrated his birthday recently and probably had a little bit excessive EtOH intake.  We discussed the concerns that it has for liver and cirrhosis.  We will check hepatic function panel to see status of transaminases. - Hepatic Function Panel (6)    Otilio Miu, MD

## 2021-10-01 LAB — HEPATIC FUNCTION PANEL (6)
ALT: 25 IU/L (ref 0–44)
AST: 29 IU/L (ref 0–40)
Albumin: 4.4 g/dL (ref 3.8–4.9)
Alkaline Phosphatase: 60 IU/L (ref 44–121)
Bilirubin Total: 0.4 mg/dL (ref 0.0–1.2)
Bilirubin, Direct: 0.14 mg/dL (ref 0.00–0.40)

## 2021-10-09 ENCOUNTER — Other Ambulatory Visit: Payer: Self-pay | Admitting: Family Medicine

## 2021-10-09 DIAGNOSIS — N342 Other urethritis: Secondary | ICD-10-CM

## 2021-10-09 DIAGNOSIS — M25562 Pain in left knee: Secondary | ICD-10-CM

## 2021-10-09 NOTE — Telephone Encounter (Signed)
Requested medications are due for refill today.  unsure  Requested medications are on the active medications list.  yes  Last refill. 09/30/2021 #14 0 refills  Future visit scheduled.   yes  Notes to clinic.  Refill is not delegated.    Requested Prescriptions  Pending Prescriptions Disp Refills   doxycycline (VIBRA-TABS) 100 MG tablet [Pharmacy Med Name: DOXYCYCLINE HYCLATE 100 MG TAB] 14 tablet 0    Sig: TAKE 1 TABLET BY MOUTH TWICE A DAY     Off-Protocol Failed - 10/09/2021  1:23 PM      Failed - Medication not assigned to a protocol, review manually.      Passed - Valid encounter within last 12 months    Recent Outpatient Visits           1 week ago Gross hematuria   [place] [facility], [facility]   1 month ago Primary osteoarthritis of left knee   [place] [facility] Ocie Bob, MD   1 month ago Arthralgia of knee, left   [place] [facility], Ocie Bob, MD   4 months ago Essential hypertension   [place] [facility], MD   10 months ago Essential hypertension   [place] [facility] at [facility], MD       Future Appointments             In 1 week Duanne Limerick, MD [facility] at [facility]   In 1 month Duanne Limerick, MD [facility] at [facility], [facility]

## 2021-10-09 NOTE — Telephone Encounter (Signed)
Requested Prescriptions  Pending Prescriptions Disp Refills  . meloxicam (MOBIC) 15 MG tablet [Pharmacy Med Name: MELOXICAM 15 MG TABLET] 14 tablet 0    Sig: TAKE 1 TABLET (15 MG TOTAL) BY MOUTH DAILY.     Analgesics:  COX2 Inhibitors Failed - 10/09/2021  1:23 PM      Failed - Manual Review: Labs are only required if the patient has taken medication for more than 8 weeks.      Failed - HGB in normal range and within 360 days    Hemoglobin  Date Value Ref Range Status  10/13/2014 15.0 13.0 - 18.0 g/dL Final   HGB  Date Value Ref Range Status  07/29/2011 13.6 13.0 - 18.0 g/dL Final         Failed - HCT in normal range and within 360 days    HCT  Date Value Ref Range Status  10/13/2014 45.2 40.0 - 52.0 % Final  07/29/2011 40.0 40.0 - 52.0 % Final         Passed - Cr in normal range and within 360 days    Creatinine  Date Value Ref Range Status  07/29/2011 1.00 0.60 - 1.30 mg/dL Final   Creatinine, Ser  Date Value Ref Range Status  11/16/2020 1.15 0.76 - 1.27 mg/dL Final         Passed - AST in normal range and within 360 days    AST  Date Value Ref Range Status  09/30/2021 29 0 - 40 IU/L Final         Passed - ALT in normal range and within 360 days    ALT  Date Value Ref Range Status  09/30/2021 25 0 - 44 IU/L Final         Passed - eGFR is 30 or above and within 360 days    EGFR (African American)  Date Value Ref Range Status  07/29/2011 >60  Final   GFR calc Af Amer  Date Value Ref Range Status  07/09/2018 76 >59 mL/min/1.73 Final   EGFR (Non-African Amer.)  Date Value Ref Range Status  07/29/2011 >60  Final    Comment:    eGFR values <45m/min/1.73 m2 may be an indication of chronic kidney disease (CKD). Calculated eGFR is useful in patients with stable renal function. The eGFR calculation will not be reliable in acutely ill patients when serum creatinine is changing rapidly. It is not useful in  patients on dialysis. The eGFR calculation may not be  applicable to patients at the low and high extremes of body sizes, pregnant women, and vegetarians.    GFR calc non Af Amer  Date Value Ref Range Status  07/09/2018 66 >59 mL/min/1.73 Final   eGFR  Date Value Ref Range Status  11/16/2020 76 >59 mL/min/1.73 Final         Passed - Patient is not pregnant      Passed - Valid encounter within last 12 months    Recent Outpatient Visits          1 week ago Gross hematuria   [place] [facility]   1 month ago Primary osteoarthritis of left knee   [place] [facility]   1 month ago Arthralgia of knee, left   [facility]   4 months ago Essential hypertension  [facility] at [place], [place]   10 months ago Essential hypertension   [place] [facility] at [facility], MD      Future Appointments            In 1 week Juline Patch, MD [facility] at [facility], [place]   In 1 month Juline Patch, MD [facility] at [facility], [facility]

## 2021-10-17 ENCOUNTER — Ambulatory Visit: Payer: BC Managed Care – PPO | Admitting: Family Medicine

## 2021-11-18 ENCOUNTER — Ambulatory Visit (INDEPENDENT_AMBULATORY_CARE_PROVIDER_SITE_OTHER): Payer: BC Managed Care – PPO | Admitting: Family Medicine

## 2021-11-18 ENCOUNTER — Encounter: Payer: Self-pay | Admitting: Family Medicine

## 2021-11-18 VITALS — BP 130/80 | HR 78 | Ht 72.0 in | Wt 238.0 lb

## 2021-11-18 DIAGNOSIS — K219 Gastro-esophageal reflux disease without esophagitis: Secondary | ICD-10-CM | POA: Diagnosis not present

## 2021-11-18 DIAGNOSIS — I10 Essential (primary) hypertension: Secondary | ICD-10-CM | POA: Diagnosis not present

## 2021-11-18 DIAGNOSIS — E7801 Familial hypercholesterolemia: Secondary | ICD-10-CM

## 2021-11-18 MED ORDER — ATORVASTATIN CALCIUM 10 MG PO TABS: 10.0000 mg | ORAL_TABLET | Freq: Every day | ORAL | 1 refills | Status: DC

## 2021-11-18 MED ORDER — PANTOPRAZOLE SODIUM 40 MG PO TBEC: 40.0000 mg | DELAYED_RELEASE_TABLET | Freq: Every day | ORAL | 1 refills | Status: DC

## 2021-11-18 MED ORDER — METOPROLOL SUCCINATE ER 100 MG PO TB24: 100.0000 mg | ORAL_TABLET | Freq: Every day | ORAL | 1 refills | Status: DC

## 2021-11-18 MED ORDER — HYDROCHLOROTHIAZIDE 12.5 MG PO TABS: 12.5000 mg | ORAL_TABLET | Freq: Every day | ORAL | 1 refills | Status: DC

## 2021-11-18 MED ORDER — AMLODIPINE BESYLATE 10 MG PO TABS: ORAL_TABLET | ORAL | 1 refills | Status: DC

## 2021-11-18 NOTE — Progress Notes (Signed)
Date:  11/18/2021   Name:  Calvin Lynch   DOB:  1965/03/09   MRN:  [id number]   Chief Complaint: Hypertension, Gastroesophageal Reflux, and Hyperlipidemia  Hypertension This is a chronic problem. The current episode started more than 1 year ago. The problem has been gradually improving since onset. The problem is controlled. Pertinent negatives include no anxiety, blurred vision, chest pain, headaches, malaise/fatigue, neck pain, orthopnea, palpitations, peripheral edema, PND, shortness of breath or sweats. There are no associated agents to hypertension. There are no known risk factors for coronary artery disease. Past treatments include calcium channel blockers, beta blockers and diuretics. The current treatment provides moderate improvement. There are no compliance problems.  There is no history of angina, kidney disease, CAD/MI, CVA, heart failure, left ventricular hypertrophy, PVD or retinopathy. There is no history of chronic renal disease, a hypertension causing med or renovascular disease.  Gastroesophageal Reflux He reports no abdominal pain, no belching, no chest pain, no choking, no coughing, no dysphagia, no early satiety, no globus sensation, no heartburn, no hoarse voice, no nausea, no sore throat, no stridor or no wheezing. This is a chronic problem. The problem has been gradually improving. Nothing aggravates the symptoms. Pertinent negatives include no anemia, fatigue, melena, muscle weakness, orthopnea or weight loss. He has tried a PPI for the symptoms. The treatment provided significant relief.  Hyperlipidemia This is a chronic problem. The current episode started more than 1 year ago. The problem is controlled. Recent lipid tests were reviewed and are normal. He has no history of chronic renal disease, diabetes, hypothyroidism, liver disease, obesity or nephrotic syndrome. There are no known factors aggravating his hyperlipidemia. Pertinent negatives include no chest pain, focal  sensory loss, focal weakness, leg pain, myalgias or shortness of breath. Current antihyperlipidemic treatment includes statins. The current treatment provides moderate improvement of lipids.    Lab Results  Component Value Date   NA 140 11/16/2020   K 4.2 11/16/2020   CO2 23 11/16/2020   GLUCOSE 95 11/16/2020   BUN 10 11/16/2020   CREATININE 1.15 11/16/2020   CALCIUM 9.3 11/16/2020   EGFR 76 11/16/2020   GFRNONAA 66 07/09/2018   Lab Results  Component Value Date   CHOL 168 11/16/2020   HDL 64 11/16/2020   LDLCALC 72 11/16/2020   TRIG 197 (H) 11/16/2020   CHOLHDL 3.6 02/15/2019   No results found for: "TSH" No results found for: "HGBA1C" Lab Results  Component Value Date   WBC 6.9 10/13/2014   HGB 15.0 10/13/2014   HCT 45.2 10/13/2014   MCV 95.1 10/13/2014   PLT 280 10/13/2014   Lab Results  Component Value Date   ALT 25 09/30/2021   AST 29 09/30/2021   ALKPHOS 60 09/30/2021   BILITOT 0.4 09/30/2021   No results found for: "25OHVITD2", "25OHVITD3", "VD25OH"   Review of Systems  Constitutional:  Negative for fatigue, malaise/fatigue and weight loss.  HENT:  Negative for hoarse voice, sore throat and trouble swallowing.   Eyes:  Negative for blurred vision.  Respiratory:  Negative for cough, choking, chest tightness, shortness of breath and wheezing.   Cardiovascular:  Negative for chest pain, palpitations, orthopnea, leg swelling and PND.  Gastrointestinal:  Negative for abdominal distention, abdominal pain, blood in stool, dysphagia, heartburn, melena and nausea.  Endocrine: Negative for polydipsia and polyuria.  Genitourinary:  Negative for difficulty urinating.  Musculoskeletal:  Negative for myalgias, muscle weakness and neck pain.  Neurological:  Negative for focal weakness and  headaches.    Patient Active Problem List   Diagnosis Date Noted   Primary osteoarthritis of left knee 08/21/2021   Essential hypertension 10/13/2014   Esophageal reflux 10/13/2014     No Known Allergies  No past surgical history on file.  Social History   Tobacco Use   Smoking status: Every Day    Packs/day: 1.00    Years: 36.00    Total pack years: 36.00    Types: Cigarettes   Smokeless tobacco: Former  Building services engineer Use: Never used  Substance Use Topics   Alcohol use: Yes    Alcohol/week: 0.0 standard drinks of alcohol    Comment: pt states a 6 pack a day   Drug use: No     Medication list has been reviewed and updated.  Current Meds  Medication Sig   amLODipine (NORVASC) 10 MG tablet TAKE 1 TABLET (10 MG TOTAL) BY MOUTH DAILY AT 6 (SIX) AM.   aspirin EC 81 MG tablet Take 81 mg by mouth daily at 6 (six) AM.   atorvastatin (LIPITOR) 10 MG tablet Take 1 tablet (10 mg total) by mouth daily.   hydrochlorothiazide (HYDRODIURIL) 12.5 MG tablet Take 1 tablet (12.5 mg total) by mouth daily.   metoprolol succinate (TOPROL-XL) 100 MG 24 hr tablet Take 1 tablet (100 mg total) by mouth daily. Take with or immediately following a meal.   pantoprazole (PROTONIX) 40 MG tablet Take 1 tablet (40 mg total) by mouth daily.       11/18/2021    9:08 AM 09/30/2021   11:32 AM 09/05/2021    8:08 AM 05/16/2021    9:10 AM  GAD 7 : Generalized Anxiety Score  Nervous, Anxious, on Edge 0 0 0 0  Control/stop worrying 0 0 0 0  Worry too much - different things 0 0 0 0  Trouble relaxing 0 0 0 0  Restless 0 0 0 0  Easily annoyed or irritable 0 0 0 0  Afraid - awful might happen 0 0 0 0  Total GAD 7 Score 0 0 0 0  Anxiety Difficulty Not difficult at all Not difficult at all  Not difficult at all       11/18/2021    9:08 AM 09/30/2021   11:32 AM 09/05/2021    8:08 AM  Depression screen PHQ 2/9  Decreased Interest 0 0 3  Down, Depressed, Hopeless 0 0 0  PHQ - 2 Score 0 0 3  Altered sleeping 0 0 0  Tired, decreased energy 0 0 3  Change in appetite 0 0 0  Feeling bad or failure about yourself  0 0 0  Trouble concentrating 0 0 0  Moving slowly or  fidgety/restless 0 0 0  Suicidal thoughts 0 0 0  PHQ-9 Score 0 0 6  Difficult doing work/chores Not difficult at all Not difficult at all Not difficult at all    BP Readings from Last 3 Encounters:  11/18/21 130/80  09/30/21 (!) 158/92  09/05/21 (!) 150/88    Physical Exam Vitals and nursing note reviewed.  HENT:     Head: Normocephalic.     Right Ear: Tympanic membrane and external ear normal.     Left Ear: Tympanic membrane and external ear normal.     Nose: Nose normal.  Eyes:     General: No scleral icterus.       Right eye: No discharge.        Left eye: No discharge.  Conjunctiva/sclera: Conjunctivae normal.     Pupils: Pupils are equal, round, and reactive to light.  Neck:     Thyroid: No thyromegaly.     Vascular: No JVD.     Trachea: No tracheal deviation.  Cardiovascular:     Rate and Rhythm: Normal rate and regular rhythm.     Heart sounds: Normal heart sounds, S1 normal and S2 normal. No murmur heard.    No systolic murmur is present.     No diastolic murmur is present.     No friction rub. No gallop. No S3 or S4 sounds.  Pulmonary:     Effort: No respiratory distress.     Breath sounds: Normal breath sounds. No wheezing or rales.  Abdominal:     General: Bowel sounds are normal.     Palpations: Abdomen is soft. There is no mass.     Tenderness: There is no abdominal tenderness. There is no guarding or rebound.  Musculoskeletal:        General: No tenderness. Normal range of motion.     Cervical back: Normal range of motion and neck supple.     Right lower leg: No edema.     Left lower leg: No edema.  Lymphadenopathy:     Cervical: No cervical adenopathy.  Skin:    General: Skin is warm.     Findings: No rash.  Neurological:     Mental Status: He is alert and oriented to person, place, and time.     Cranial Nerves: No cranial nerve deficit.     Deep Tendon Reflexes: Reflexes are normal and symmetric.     Wt Readings from Last 3 Encounters:   11/18/21 238 lb (108 kg)  09/30/21 238 lb (108 kg)  09/05/21 236 lb (107 kg)    BP 130/80   Pulse 78   Ht 6' (1.829 m)   Wt 238 lb (108 kg)   BMI 32.28 kg/m   Assessment and Plan:  1. Essential hypertension Chronic.  Controlled.  Stable.  Blood pressure 130/80.  Continue amlodipine 10 mg once a day, hydrochlorothiazide 12.5 mg once a day and metoprolol XL 100 mg once a day.  We will check renal function panel. - amLODipine (NORVASC) 10 MG tablet; TAKE 1 TABLET (10 MG TOTAL) BY MOUTH DAILY AT 6 (SIX) AM.  Dispense: 90 tablet; Refill: 1 - hydrochlorothiazide (HYDRODIURIL) 12.5 MG tablet; Take 1 tablet (12.5 mg total) by mouth daily.  Dispense: 90 tablet; Refill: 1 - metoprolol succinate (TOPROL-XL) 100 MG 24 hr tablet; Take 1 tablet (100 mg total) by mouth daily. Take with or immediately following a meal.  Dispense: 90 tablet; Refill: 1 - Renal Function Panel  2. Familial hypercholesterolemia Chronic.  Controlled.  Stable.  Continue atorvastatin 10 mg once a day.  Will check lipid panel. - atorvastatin (LIPITOR) 10 MG tablet; Take 1 tablet (10 mg total) by mouth daily.  Dispense: 90 tablet; Refill: 1 - Lipid Panel With LDL/HDL Ratio  3. Gastroesophageal reflux disease, unspecified whether esophagitis present Chronic.  Controlled.  Stable.  Continue pantoprazole 40 mg once a day. - pantoprazole (PROTONIX) 40 MG tablet; Take 1 tablet (40 mg total) by mouth daily.  Dispense: 90 tablet; Refill: 1    Otilio Miu, MD

## 2021-11-19 LAB — RENAL FUNCTION PANEL
Albumin: 4.5 g/dL (ref 3.8–4.9)
BUN/Creatinine Ratio: 9 (ref 9–20)
BUN: 10 mg/dL (ref 6–24)
CO2: 23 mmol/L (ref 20–29)
Calcium: 9.4 mg/dL (ref 8.7–10.2)
Chloride: 99 mmol/L (ref 96–106)
Creatinine, Ser: 1.12 mg/dL (ref 0.76–1.27)
Glucose: 101 mg/dL — ABNORMAL HIGH (ref 70–99)
Phosphorus: 3.9 mg/dL (ref 2.8–4.1)
Potassium: 4.3 mmol/L (ref 3.5–5.2)
Sodium: 140 mmol/L (ref 134–144)
eGFR: 78 mL/min/{1.73_m2} (ref >59)

## 2021-11-19 LAB — LIPID PANEL WITH LDL/HDL RATIO
Cholesterol, Total: 216 mg/dL — ABNORMAL HIGH (ref 100–199)
HDL: 58 mg/dL (ref >39)
LDL Chol Calc (NIH): 126 mg/dL — ABNORMAL HIGH (ref 0–99)
LDL/HDL Ratio: 2.2 ratio (ref 0.0–3.6)
Triglycerides: 185 mg/dL — ABNORMAL HIGH (ref 0–149)
VLDL Cholesterol Cal: 32 mg/dL (ref 5–40)

## 2021-11-20 ENCOUNTER — Encounter: Payer: Self-pay | Admitting: Family Medicine

## 2022-04-30 ENCOUNTER — Ambulatory Visit: Payer: BC Managed Care – PPO | Admitting: Podiatry

## 2022-05-20 ENCOUNTER — Encounter: Payer: Self-pay | Admitting: Family Medicine

## 2022-05-20 ENCOUNTER — Ambulatory Visit (INDEPENDENT_AMBULATORY_CARE_PROVIDER_SITE_OTHER): Payer: BC Managed Care – PPO | Admitting: Family Medicine

## 2022-05-20 VITALS — BP 122/78 | HR 76 | Ht 72.0 in | Wt 247.0 lb

## 2022-05-20 DIAGNOSIS — E7801 Familial hypercholesterolemia: Secondary | ICD-10-CM

## 2022-05-20 DIAGNOSIS — K219 Gastro-esophageal reflux disease without esophagitis: Secondary | ICD-10-CM

## 2022-05-20 DIAGNOSIS — L2084 Intrinsic (allergic) eczema: Secondary | ICD-10-CM | POA: Diagnosis not present

## 2022-05-20 DIAGNOSIS — I10 Essential (primary) hypertension: Secondary | ICD-10-CM

## 2022-05-20 MED ORDER — AMLODIPINE BESYLATE 10 MG PO TABS: ORAL_TABLET | ORAL | 1 refills | Status: DC

## 2022-05-20 MED ORDER — PANTOPRAZOLE SODIUM 40 MG PO TBEC: 40.0000 mg | DELAYED_RELEASE_TABLET | Freq: Every day | ORAL | 1 refills | Status: DC

## 2022-05-20 MED ORDER — ATORVASTATIN CALCIUM 10 MG PO TABS: 10.0000 mg | ORAL_TABLET | Freq: Every day | ORAL | 1 refills | Status: DC

## 2022-05-20 MED ORDER — TRIAMCINOLONE ACETONIDE 0.1 % EX CREA
1.0000 | TOPICAL_CREAM | Freq: Two times a day (BID) | CUTANEOUS | 2 refills | Status: DC
Start: 2022-05-20 — End: 2022-08-29

## 2022-05-20 MED ORDER — METOPROLOL SUCCINATE ER 100 MG PO TB24: 100.0000 mg | ORAL_TABLET | Freq: Every day | ORAL | 1 refills | Status: DC

## 2022-05-20 MED ORDER — HYDROCHLOROTHIAZIDE 12.5 MG PO TABS: 12.5000 mg | ORAL_TABLET | Freq: Every day | ORAL | 1 refills | Status: DC

## 2022-05-20 NOTE — Progress Notes (Signed)
Date:  05/20/2022   Name:  Calvin Lynch   DOB:  [birth year]   MRN:  [id number]   Chief Complaint: Hypertension, Hyperlipidemia, and Gastroesophageal Reflux  Hypertension This is a chronic problem. The current episode started more than 1 year ago. The problem has been gradually improving since onset. The problem is controlled. Associated symptoms include palpitations. Pertinent negatives include no blurred vision, chest pain, headaches, orthopnea, peripheral edema, PND or shortness of breath. There are no associated agents to hypertension. Risk factors for coronary artery disease include dyslipidemia and post-menopausal state. Past treatments include diuretics, calcium channel blockers and beta blockers. There are no compliance problems.  There is no history of CAD/MI or CVA. There is no history of chronic renal disease, a hypertension causing med or renovascular disease.  Hyperlipidemia This is a chronic problem. The current episode started more than 1 year ago. The problem is controlled. Recent lipid tests were reviewed and are normal. He has no history of chronic renal disease, diabetes, hypothyroidism, liver disease, obesity or nephrotic syndrome. There are no known factors aggravating his hyperlipidemia. Pertinent negatives include no chest pain or shortness of breath. Current antihyperlipidemic treatment includes statins. The current treatment provides moderate improvement of lipids. There are no compliance problems.  Risk factors for coronary artery disease include dyslipidemia.  Gastroesophageal Reflux He reports no chest pain, no coughing, no dysphagia, no heartburn, no nausea, no sore throat or no wheezing. This is a chronic problem. The current episode started more than 1 year ago. The problem occurs frequently. The problem has been gradually improving. The symptoms are aggravated by certain foods. Pertinent negatives include no fatigue or melena. There are no known risk factors. He has  tried a PPI for the symptoms. The treatment provided moderate relief.    Lab Results  Component Value Date   NA 140 11/18/2021   K 4.3 11/18/2021   CO2 23 11/18/2021   GLUCOSE 101 (H) 11/18/2021   BUN 10 11/18/2021   CREATININE 1.12 11/18/2021   CALCIUM 9.4 11/18/2021   EGFR 78 11/18/2021   GFRNONAA 66 07/09/2018   Lab Results  Component Value Date   CHOL 216 (H) 11/18/2021   HDL 58 11/18/2021   LDLCALC 126 (H) 11/18/2021   TRIG 185 (H) 11/18/2021   CHOLHDL 3.6 02/15/2019   No results found for: "TSH" No results found for: "HGBA1C" Lab Results  Component Value Date   WBC 6.9 10/13/2014   HGB 15.0 10/13/2014   HCT 45.2 10/13/2014   MCV 95.1 10/13/2014   PLT 280 10/13/2014   Lab Results  Component Value Date   ALT 25 09/30/2021   AST 29 09/30/2021   ALKPHOS 60 09/30/2021   BILITOT 0.4 09/30/2021   No results found for: "25OHVITD2", "25OHVITD3", "VD25OH"   Review of Systems  Constitutional:  Negative for fatigue and unexpected weight change.  HENT:  Negative for sore throat and trouble swallowing.   Eyes:  Negative for blurred vision.  Respiratory:  Negative for cough, chest tightness, shortness of breath and wheezing.   Cardiovascular:  Positive for palpitations. Negative for chest pain, orthopnea, leg swelling and PND.  Gastrointestinal:  Negative for blood in stool, constipation, diarrhea, dysphagia, heartburn, melena and nausea.  Genitourinary:  Negative for difficulty urinating.  Skin:  Negative for color change.  Neurological:  Negative for headaches.  Hematological:  Negative for adenopathy.    Patient Active Problem List   Diagnosis Date Noted   Primary osteoarthritis of left knee 08/21/2021  Essential hypertension 10/13/2014   Esophageal reflux 10/13/2014    No Known Allergies  No past surgical history on file.  Social History   Tobacco Use   Smoking status: Every Day    Packs/day: 1.00    Years: 36.00    Additional pack years: 0.00     Total pack years: 36.00    Types: Cigarettes   Smokeless tobacco: Former  Scientific laboratory technician Use: Never used  Substance Use Topics   Alcohol use: Yes    Alcohol/week: 0.0 standard drinks of alcohol    Comment: pt states a 6 pack a day   Drug use: No     Medication list has been reviewed and updated.  Current Meds  Medication Sig   amLODipine (NORVASC) 10 MG tablet TAKE 1 TABLET (10 MG TOTAL) BY MOUTH DAILY AT 6 (SIX) AM.   aspirin EC 81 MG tablet Take 81 mg by mouth daily at 6 (six) AM.   atorvastatin (LIPITOR) 10 MG tablet Take 1 tablet (10 mg total) by mouth daily.   hydrochlorothiazide (HYDRODIURIL) 12.5 MG tablet Take 1 tablet (12.5 mg total) by mouth daily.   metoprolol succinate (TOPROL-XL) 100 MG 24 hr tablet Take 1 tablet (100 mg total) by mouth daily. Take with or immediately following a meal.   pantoprazole (PROTONIX) 40 MG tablet Take 1 tablet (40 mg total) by mouth daily.       11/18/2021    9:08 AM 09/30/2021   11:32 AM 09/05/2021    8:08 AM 05/16/2021    9:10 AM  GAD 7 : Generalized Anxiety Score  Nervous, Anxious, on Edge 0 0 0 0  Control/stop worrying 0 0 0 0  Worry too much - different things 0 0 0 0  Trouble relaxing 0 0 0 0  Restless 0 0 0 0  Easily annoyed or irritable 0 0 0 0  Afraid - awful might happen 0 0 0 0  Total GAD 7 Score 0 0 0 0  Anxiety Difficulty Not difficult at all Not difficult at all  Not difficult at all       11/18/2021    9:08 AM 09/30/2021   11:32 AM 09/05/2021    8:08 AM  Depression screen PHQ 2/9  Decreased Interest 0 0 3  Down, Depressed, Hopeless 0 0 0  PHQ - 2 Score 0 0 3  Altered sleeping 0 0 0  Tired, decreased energy 0 0 3  Change in appetite 0 0 0  Feeling bad or failure about yourself  0 0 0  Trouble concentrating 0 0 0  Moving slowly or fidgety/restless 0 0 0  Suicidal thoughts 0 0 0  PHQ-9 Score 0 0 6  Difficult doing work/chores Not difficult at all Not difficult at all Not difficult at all    BP Readings  from Last 3 Encounters:  05/20/22 122/78  11/18/21 130/80  09/30/21 (!) 158/92    Physical Exam Vitals and nursing note reviewed.  HENT:     Head: Normocephalic.     Right Ear: Tympanic membrane and external ear normal.     Left Ear: Tympanic membrane and external ear normal.     Nose: Nose normal. No congestion or rhinorrhea.     Mouth/Throat:     Mouth: Mucous membranes are moist.     Pharynx: No oropharyngeal exudate.  Eyes:     General: No scleral icterus.       Right eye: No discharge.  Left eye: No discharge.     Conjunctiva/sclera: Conjunctivae normal.     Pupils: Pupils are equal, round, and reactive to light.  Neck:     Thyroid: No thyromegaly.     Vascular: No JVD.     Trachea: No tracheal deviation.  Cardiovascular:     Rate and Rhythm: Normal rate and regular rhythm.     Heart sounds: Normal heart sounds. No murmur heard.    No friction rub. No gallop.  Pulmonary:     Effort: No respiratory distress.     Breath sounds: Normal breath sounds. No wheezing, rhonchi or rales.  Abdominal:     General: Bowel sounds are normal.     Palpations: Abdomen is soft. There is no mass.     Tenderness: There is no abdominal tenderness. There is no guarding or rebound.  Musculoskeletal:        General: No tenderness. Normal range of motion.     Cervical back: Normal range of motion and neck supple.  Lymphadenopathy:     Cervical: No cervical adenopathy.  Skin:    General: Skin is warm.     Findings: Rash present.       Neurological:     Mental Status: He is alert and oriented to person, place, and time.     Cranial Nerves: No cranial nerve deficit.     Deep Tendon Reflexes: Reflexes are normal and symmetric.     Wt Readings from Last 3 Encounters:  05/20/22 247 lb (112 kg)  11/18/21 238 lb (108 kg)  09/30/21 238 lb (108 kg)    BP 122/78   Pulse 76   Ht 6' (1.829 m)   Wt 247 lb (112 kg)   SpO2 98%   BMI 33.50 kg/m   Assessment and Plan:  1.  Essential hypertension Chronic.  Controlled.  Stable.  Blood pressure 122/78.  Asymptomatic.  Tolerating medications well.  Continue amlodipine 10 mg once a day, hydrochlorothiazide 12.5 mg once a day, and metoprolol XL 100 mg once a day.  Will check CMP for electrolytes and GFR. - amLODipine (NORVASC) 10 MG tablet; TAKE 1 TABLET (10 MG TOTAL) BY MOUTH DAILY AT 6 (SIX) AM.  Dispense: 90 tablet; Refill: 1 - hydrochlorothiazide (HYDRODIURIL) 12.5 MG tablet; Take 1 tablet (12.5 mg total) by mouth daily.  Dispense: 90 tablet; Refill: 1 - metoprolol succinate (TOPROL-XL) 100 MG 24 hr tablet; Take 1 tablet (100 mg total) by mouth daily. Take with or immediately following a meal.  Dispense: 90 tablet; Refill: 1 - Comprehensive Metabolic Panel (CMET)  2. Familial hypercholesterolemia Chronic.  Controlled.  Stable.  Continue atorvastatin 10 mg once a day continue atorvastatin 10 mg once a day.  Will check lipid panel for current status of LDL control. - atorvastatin (LIPITOR) 10 MG tablet; Take 1 tablet (10 mg total) by mouth daily.  Dispense: 90 tablet; Refill: 1 - Lipid Panel With LDL/HDL Ratio  3. Gastroesophageal reflux disease, unspecified whether esophagitis present Chronic.  Controlled.  Stable.  Continue pantoprazole 40 mg once a day. - pantoprazole (PROTONIX) 40 MG tablet; Take 1 tablet (40 mg total) by mouth daily.  Dispense: 90 tablet; Refill: 1  4. Intrinsic eczema New onset.  Areas of pruritic macular rash with some scaling.  This is consistent with eczema and will treat with triamcinolone cream 0.1% apply twice a day. - triamcinolone cream (KENALOG) 0.1 %; Apply 1 Application topically 2 (two) times daily.  Dispense: 30 g; Refill: 2  Otilio Miu, MD

## 2022-06-05 ENCOUNTER — Telehealth: Payer: Self-pay

## 2022-06-05 NOTE — Telephone Encounter (Signed)
Called pt's cell to come in and get labs drawn. He said he will come Tuesday

## 2022-06-08 ENCOUNTER — Encounter: Payer: Self-pay | Admitting: Family Medicine

## 2022-06-09 ENCOUNTER — Other Ambulatory Visit: Payer: Self-pay

## 2022-06-09 DIAGNOSIS — E7801 Familial hypercholesterolemia: Secondary | ICD-10-CM

## 2022-06-09 DIAGNOSIS — I10 Essential (primary) hypertension: Secondary | ICD-10-CM | POA: Diagnosis not present

## 2022-06-09 NOTE — Progress Notes (Unsigned)
Printed labs

## 2022-06-10 ENCOUNTER — Other Ambulatory Visit: Payer: Self-pay | Admitting: Acute Care

## 2022-06-10 DIAGNOSIS — F17210 Nicotine dependence, cigarettes, uncomplicated: Secondary | ICD-10-CM

## 2022-06-10 DIAGNOSIS — Z122 Encounter for screening for malignant neoplasm of respiratory organs: Secondary | ICD-10-CM

## 2022-06-10 DIAGNOSIS — Z87891 Personal history of nicotine dependence: Secondary | ICD-10-CM

## 2022-06-10 LAB — COMPREHENSIVE METABOLIC PANEL
ALT: 26 IU/L (ref 0–44)
AST: 33 IU/L (ref 0–40)
Albumin/Globulin Ratio: 1.6 (ref 1.2–2.2)
Albumin: 4.3 g/dL (ref 3.8–4.9)
Alkaline Phosphatase: 76 IU/L (ref 44–121)
BUN/Creatinine Ratio: 9 (ref 9–20)
BUN: 11 mg/dL (ref 6–24)
Bilirubin Total: 0.8 mg/dL (ref 0.0–1.2)
CO2: 22 mmol/L (ref 20–29)
Calcium: 9.4 mg/dL (ref 8.7–10.2)
Chloride: 96 mmol/L (ref 96–106)
Creatinine, Ser: 1.17 mg/dL (ref 0.76–1.27)
Globulin, Total: 2.7 g/dL (ref 1.5–4.5)
Glucose: 110 mg/dL — ABNORMAL HIGH (ref 70–99)
Potassium: 4.6 mmol/L (ref 3.5–5.2)
Sodium: 136 mmol/L (ref 134–144)
Total Protein: 7 g/dL (ref 6.0–8.5)
eGFR: 74 mL/min/{1.73_m2} (ref >59)

## 2022-06-10 LAB — LIPID PANEL WITH LDL/HDL RATIO
Cholesterol, Total: 245 mg/dL — ABNORMAL HIGH (ref 100–199)
HDL: 61 mg/dL (ref >39)
LDL Chol Calc (NIH): 144 mg/dL — ABNORMAL HIGH (ref 0–99)
LDL/HDL Ratio: 2.4 ratio (ref 0.0–3.6)
Triglycerides: 224 mg/dL — ABNORMAL HIGH (ref 0–149)
VLDL Cholesterol Cal: 40 mg/dL (ref 5–40)

## 2022-06-30 ENCOUNTER — Ambulatory Visit
Admission: RE | Admit: 2022-06-30 | Discharge: 2022-06-30 | Disposition: A | Discharge status: Home / Self Care | Payer: BC Managed Care – PPO | Source: Ambulatory Visit | Attending: Family Medicine | Admitting: Family Medicine

## 2022-06-30 DIAGNOSIS — Z122 Encounter for screening for malignant neoplasm of respiratory organs: Secondary | ICD-10-CM | POA: Insufficient documentation

## 2022-06-30 DIAGNOSIS — Z87891 Personal history of nicotine dependence: Secondary | ICD-10-CM | POA: Insufficient documentation

## 2022-06-30 DIAGNOSIS — F17210 Nicotine dependence, cigarettes, uncomplicated: Secondary | ICD-10-CM | POA: Insufficient documentation

## 2022-07-03 ENCOUNTER — Other Ambulatory Visit: Payer: Self-pay

## 2022-07-03 DIAGNOSIS — F17210 Nicotine dependence, cigarettes, uncomplicated: Secondary | ICD-10-CM

## 2022-07-03 DIAGNOSIS — Z122 Encounter for screening for malignant neoplasm of respiratory organs: Secondary | ICD-10-CM

## 2022-07-03 DIAGNOSIS — Z87891 Personal history of nicotine dependence: Secondary | ICD-10-CM

## 2022-08-29 ENCOUNTER — Encounter: Payer: Self-pay | Admitting: Family Medicine

## 2022-08-29 ENCOUNTER — Ambulatory Visit (INDEPENDENT_AMBULATORY_CARE_PROVIDER_SITE_OTHER): Payer: BC Managed Care – PPO | Admitting: Family Medicine

## 2022-08-29 VITALS — BP 136/86 | HR 96 | Ht 72.0 in | Wt 239.0 lb

## 2022-08-29 DIAGNOSIS — L2084 Intrinsic (allergic) eczema: Secondary | ICD-10-CM | POA: Diagnosis not present

## 2022-08-29 DIAGNOSIS — M10271 Drug-induced gout, right ankle and foot: Secondary | ICD-10-CM

## 2022-08-29 DIAGNOSIS — I10 Essential (primary) hypertension: Secondary | ICD-10-CM | POA: Diagnosis not present

## 2022-08-29 MED ORDER — INDOMETHACIN 50 MG PO CAPS
50.0000 mg | ORAL_CAPSULE | Freq: Three times a day (TID) | ORAL | 1 refills | Status: AC
Start: 2022-08-29 — End: ?

## 2022-08-29 MED ORDER — TRIAMCINOLONE ACETONIDE 0.1 % EX CREA
1.0000 | TOPICAL_CREAM | Freq: Two times a day (BID) | CUTANEOUS | 2 refills | Status: AC
Start: 2022-08-29 — End: ?

## 2022-08-29 NOTE — Progress Notes (Signed)
Date:  08/29/2022   Name:  Calvin Lynch   DOB:  04-May-1965   MRN:  [id number]   Chief Complaint: Foot Pain (Whole foot hurts- started Sunday night into Monday morning. )  Foot Pain This is a new problem. The current episode started in the past 7 days. The problem occurs constantly. The problem has been unchanged. Associated symptoms include a rash. Pertinent negatives include no chest pain, chills, fatigue, fever, nausea, numbness, swollen glands or weakness. The symptoms are aggravated by standing, exertion and walking. He has tried acetaminophen (BC arthritis) for the symptoms. The treatment provided no relief.  Rash This is a chronic problem. The current episode started more than 1 year ago. The problem has been waxing and waning since onset. The affected locations include the left lower leg and right lower leg. The rash is characterized by itchiness. He was exposed to nothing. Pertinent negatives include no fatigue, fever, nail changes or shortness of breath. Past treatments include topical steroids. The treatment provided moderate relief.    Lab Results  Component Value Date   NA 136 06/09/2022   K 4.6 06/09/2022   CO2 22 06/09/2022   GLUCOSE 110 (H) 06/09/2022   BUN 11 06/09/2022   CREATININE 1.17 06/09/2022   CALCIUM 9.4 06/09/2022   EGFR 74 06/09/2022   GFRNONAA 66 07/09/2018   Lab Results  Component Value Date   CHOL 245 (H) 06/09/2022   HDL 61 06/09/2022   LDLCALC 144 (H) 06/09/2022   TRIG 224 (H) 06/09/2022   CHOLHDL 3.6 02/15/2019   No results found for: "TSH" No results found for: "HGBA1C" Lab Results  Component Value Date   WBC 6.9 10/13/2014   HGB 15.0 10/13/2014   HCT 45.2 10/13/2014   MCV 95.1 10/13/2014   PLT 280 10/13/2014   Lab Results  Component Value Date   ALT 26 06/09/2022   AST 33 06/09/2022   ALKPHOS 76 06/09/2022   BILITOT 0.8 06/09/2022   No results found for: "25OHVITD2", "25OHVITD3", "VD25OH"   Review of Systems  Constitutional:   Negative for chills, fatigue and fever.  Respiratory:  Negative for shortness of breath and wheezing.   Cardiovascular:  Negative for chest pain, palpitations and leg swelling.  Gastrointestinal:  Negative for nausea.  Skin:  Positive for rash. Negative for nail changes.  Neurological:  Negative for weakness and numbness.    Patient Active Problem List   Diagnosis Date Noted   Primary osteoarthritis of left knee 08/21/2021   Essential hypertension 10/13/2014   Esophageal reflux 10/13/2014    No Known Allergies  No past surgical history on file.  Social History   Tobacco Use   Smoking status: Every Day    Current packs/day: 1.00    Average packs/day: 1 pack/day for 36.0 years (36.0 ttl pk-yrs)    Types: Cigarettes   Smokeless tobacco: Former  Building services engineer status: Never Used  Substance Use Topics   Alcohol use: Yes    Alcohol/week: 0.0 standard drinks of alcohol    Comment: pt states a 6 pack a day   Drug use: No     Medication list has been reviewed and updated.  Current Meds  Medication Sig   amLODipine (NORVASC) 10 MG tablet TAKE 1 TABLET (10 MG TOTAL) BY MOUTH DAILY AT 6 (SIX) AM.   aspirin EC 81 MG tablet Take 81 mg by mouth daily at 6 (six) AM.   atorvastatin (LIPITOR) 10 MG tablet Take 1  tablet (10 mg total) by mouth daily.   hydrochlorothiazide (HYDRODIURIL) 12.5 MG tablet Take 1 tablet (12.5 mg total) by mouth daily.   metoprolol succinate (TOPROL-XL) 100 MG 24 hr tablet Take 1 tablet (100 mg total) by mouth daily. Take with or immediately following a meal.   pantoprazole (PROTONIX) 40 MG tablet Take 1 tablet (40 mg total) by mouth daily.   triamcinolone cream (KENALOG) 0.1 % Apply 1 Application topically 2 (two) times daily.       08/29/2022    3:27 PM 11/18/2021    9:08 AM 09/30/2021   11:32 AM 09/05/2021    8:08 AM  GAD 7 : Generalized Anxiety Score  Nervous, Anxious, on Edge 0 0 0 0  Control/stop worrying 0 0 0 0  Worry too much - different  things 0 0 0 0  Trouble relaxing 0 0 0 0  Restless 0 0 0 0  Easily annoyed or irritable 0 0 0 0  Afraid - awful might happen 0 0 0 0  Total GAD 7 Score 0 0 0 0  Anxiety Difficulty Not difficult at all Not difficult at all Not difficult at all        08/29/2022    3:27 PM 11/18/2021    9:08 AM 09/30/2021   11:32 AM  Depression screen PHQ 2/9  Decreased Interest 0 0 0  Down, Depressed, Hopeless 0 0 0  PHQ - 2 Score 0 0 0  Altered sleeping 0 0 0  Tired, decreased energy 0 0 0  Change in appetite 0 0 0  Feeling bad or failure about yourself  0 0 0  Trouble concentrating 0 0 0  Moving slowly or fidgety/restless 0 0 0  Suicidal thoughts 0 0 0  PHQ-9 Score 0 0 0  Difficult doing work/chores Not difficult at all Not difficult at all Not difficult at all    BP Readings from Last 3 Encounters:  08/29/22 (!) 150/84  05/20/22 122/78  11/18/21 130/80    Physical Exam HENT:     Right Ear: Tympanic membrane and ear canal normal.     Left Ear: Tympanic membrane and ear canal normal.     Nose: Nose normal.     Mouth/Throat:     Mouth: Mucous membranes are moist.     Pharynx: No oropharyngeal exudate or posterior oropharyngeal erythema.  Eyes:     Pupils: Pupils are equal, round, and reactive to light.  Cardiovascular:     Rate and Rhythm: Normal rate and regular rhythm.     Heart sounds: No murmur heard.    No friction rub. No gallop.  Pulmonary:     Breath sounds: No wheezing, rhonchi or rales.  Abdominal:     General: There is no distension.     Tenderness: There is no abdominal tenderness.  Skin:    Findings: Rash present.  Neurological:     Mental Status: He is alert.     Wt Readings from Last 3 Encounters:  08/29/22 239 lb (108.4 kg)  06/30/22 240 lb (108.9 kg)  05/20/22 247 lb (112 kg)    BP (!) 150/84 (BP Location: Right Arm, Cuff Size: Large)   Pulse 96   Ht 6' (1.829 m)   Wt 239 lb (108.4 kg)   SpO2 99%   BMI 32.41 kg/m   Assessment and Plan:  1.  Acute drug-induced gout of right foot Acute.  Persistent.  Uncontrolled.  Examination and history is consistent with acute gout.  Patient has tenderness overall of the foot with swelling.  There is no history of trauma.  We will check uric acid and we will initiate indomethacin 50 mg 3 times a day. - Uric acid - indomethacin (INDOCIN) 50 MG capsule; Take 1 capsule (50 mg total) by mouth 3 (three) times daily with meals.  Dispense: 30 capsule; Refill: 1  2. Intrinsic eczema Chronic.  Episodic.  Patient's had worsening during the summer of the eczema and we will refill her triamcinolone cream for application of twice a day. - triamcinolone cream (KENALOG) 0.1 %; Apply 1 Application topically 2 (two) times daily.  Dispense: 30 g; Refill: 2  3. Essential hypertension Chronic.  Uncontrolled.  Initially but after sitting and rechecking blood pressure came down to 136/86.  Will continue with current dosing and if elevated uric acid we will likely discontinue hydrochlorothiazide and go with alternative for blood pressure control.   Elizabeth Sauer, MD

## 2022-08-30 LAB — URIC ACID: Uric Acid: 8.5 mg/dL — ABNORMAL HIGH (ref 3.8–8.4)

## 2022-11-20 ENCOUNTER — Ambulatory Visit: Payer: BC Managed Care – PPO | Admitting: Family Medicine

## 2022-11-25 ENCOUNTER — Ambulatory Visit: Payer: BC Managed Care – PPO | Admitting: Family Medicine

## 2022-12-15 ENCOUNTER — Ambulatory Visit (INDEPENDENT_AMBULATORY_CARE_PROVIDER_SITE_OTHER): Payer: BC Managed Care – PPO | Admitting: Family Medicine

## 2022-12-15 ENCOUNTER — Encounter: Payer: Self-pay | Admitting: Family Medicine

## 2022-12-15 VITALS — BP 128/80 | HR 92 | Ht 72.0 in | Wt 241.0 lb

## 2022-12-15 DIAGNOSIS — I10 Essential (primary) hypertension: Secondary | ICD-10-CM | POA: Diagnosis not present

## 2022-12-15 DIAGNOSIS — E7801 Familial hypercholesterolemia: Secondary | ICD-10-CM

## 2022-12-15 DIAGNOSIS — K219 Gastro-esophageal reflux disease without esophagitis: Secondary | ICD-10-CM | POA: Diagnosis not present

## 2022-12-15 MED ORDER — AMLODIPINE BESYLATE 10 MG PO TABS
ORAL_TABLET | ORAL | 1 refills | Status: AC
Start: 2022-12-15 — End: ?

## 2022-12-15 MED ORDER — HYDROCHLOROTHIAZIDE 12.5 MG PO TABS
12.5000 mg | ORAL_TABLET | Freq: Every day | ORAL | 1 refills | Status: AC
Start: 2022-12-15 — End: ?

## 2022-12-15 MED ORDER — METOPROLOL SUCCINATE ER 100 MG PO TB24
100.0000 mg | ORAL_TABLET | Freq: Every day | ORAL | 1 refills | Status: AC
Start: 2022-12-15 — End: ?

## 2022-12-15 MED ORDER — PANTOPRAZOLE SODIUM 40 MG PO TBEC
40.0000 mg | DELAYED_RELEASE_TABLET | Freq: Every day | ORAL | 1 refills | Status: AC
Start: 2022-12-15 — End: ?

## 2022-12-15 MED ORDER — ATORVASTATIN CALCIUM 10 MG PO TABS
10.0000 mg | ORAL_TABLET | Freq: Every day | ORAL | 1 refills | Status: AC
Start: 2022-12-15 — End: ?

## 2022-12-15 NOTE — Patient Instructions (Signed)

## 2022-12-15 NOTE — Progress Notes (Signed)
Date:  12/15/2022   Name:  Calvin Lynch   DOB:  05/04/1965   MRN:  [id number]   Chief Complaint: Hypertension, Hyperlipidemia, and Gastroesophageal Reflux  Hypertension This is a chronic problem. The current episode started more than 1 year ago. The problem is controlled. Pertinent negatives include no anxiety, blurred vision, chest pain, headaches, malaise/fatigue, neck pain, orthopnea, palpitations, peripheral edema, PND, shortness of breath or sweats. There are no associated agents to hypertension. There are no known risk factors for coronary artery disease. Past treatments include calcium channel blockers, beta blockers and diuretics. The current treatment provides moderate improvement. There are no compliance problems.  There is no history of CAD/MI or CVA. There is no history of chronic renal disease, a hypertension causing med or renovascular disease.  Hyperlipidemia This is a chronic problem. The current episode started more than 1 year ago. Recent lipid tests were reviewed and are normal. He has no history of chronic renal disease. Pertinent negatives include no chest pain or shortness of breath. Current antihyperlipidemic treatment includes statins. The current treatment provides moderate improvement of lipids. There are no compliance problems.   Gastroesophageal Reflux He reports no belching, no chest pain, no coughing, no dysphagia, no nausea, no sore throat, no stridor or no wheezing. The problem has been gradually improving. The symptoms are aggravated by certain foods. He has tried a PPI for the symptoms. The treatment provided moderate relief.    Lab Results  Component Value Date   NA 136 06/09/2022   K 4.6 06/09/2022   CO2 22 06/09/2022   GLUCOSE 110 (H) 06/09/2022   BUN 11 06/09/2022   CREATININE 1.17 06/09/2022   CALCIUM 9.4 06/09/2022   EGFR 74 06/09/2022   GFRNONAA 66 07/09/2018   Lab Results  Component Value Date   CHOL 245 (H) 06/09/2022   HDL 61 06/09/2022    LDLCALC 144 (H) 06/09/2022   TRIG 224 (H) 06/09/2022   CHOLHDL 3.6 02/15/2019   No results found for: "TSH" No results found for: "HGBA1C" Lab Results  Component Value Date   WBC 6.9 10/13/2014   HGB 15.0 10/13/2014   HCT 45.2 10/13/2014   MCV 95.1 10/13/2014   PLT 280 10/13/2014   Lab Results  Component Value Date   ALT 26 06/09/2022   AST 33 06/09/2022   ALKPHOS 76 06/09/2022   BILITOT 0.8 06/09/2022   No results found for: "25OHVITD2", "25OHVITD3", "VD25OH"   Review of Systems  Constitutional:  Negative for malaise/fatigue.  HENT:  Negative for sore throat and trouble swallowing.   Eyes:  Negative for blurred vision and visual disturbance.  Respiratory:  Negative for cough, chest tightness, shortness of breath and wheezing.   Cardiovascular:  Negative for chest pain, palpitations, orthopnea and PND.  Gastrointestinal:  Negative for blood in stool, dysphagia and nausea.  Endocrine: Negative for polydipsia and polyuria.  Genitourinary:  Negative for hematuria.  Musculoskeletal:  Negative for neck pain.  Neurological:  Negative for headaches.    Patient Active Problem List   Diagnosis Date Noted   Primary osteoarthritis of left knee 08/21/2021   Essential hypertension 10/13/2014   Esophageal reflux 10/13/2014    No Known Allergies  History reviewed. No pertinent surgical history.  Social History   Tobacco Use   Smoking status: Every Day    Current packs/day: 1.00    Average packs/day: 1 pack/day for 36.0 years (36.0 ttl pk-yrs)    Types: Cigarettes   Smokeless tobacco: Former  Psychologist, educational  Use   Vaping status: Never Used  Substance Use Topics   Alcohol use: Yes    Alcohol/week: 0.0 standard drinks of alcohol    Comment: pt states a 6 pack a day   Drug use: No     Medication list has been reviewed and updated.  Current Meds  Medication Sig   amLODipine (NORVASC) 10 MG tablet TAKE 1 TABLET (10 MG TOTAL) BY MOUTH DAILY AT 6 (SIX) AM.   aspirin EC 81 MG  tablet Take 81 mg by mouth daily at 6 (six) AM.   atorvastatin (LIPITOR) 10 MG tablet Take 1 tablet (10 mg total) by mouth daily.   hydrochlorothiazide (HYDRODIURIL) 12.5 MG tablet Take 1 tablet (12.5 mg total) by mouth daily.   indomethacin (INDOCIN) 50 MG capsule Take 1 capsule (50 mg total) by mouth 3 (three) times daily with meals.   metoprolol succinate (TOPROL-XL) 100 MG 24 hr tablet Take 1 tablet (100 mg total) by mouth daily. Take with or immediately following a meal.   pantoprazole (PROTONIX) 40 MG tablet Take 1 tablet (40 mg total) by mouth daily.   triamcinolone cream (KENALOG) 0.1 % Apply 1 Application topically 2 (two) times daily.       12/15/2022    8:02 AM 08/29/2022    3:27 PM 11/18/2021    9:08 AM 09/30/2021   11:32 AM  GAD 7 : Generalized Anxiety Score  Nervous, Anxious, on Edge 0 0 0 0  Control/stop worrying 0 0 0 0  Worry too much - different things 0 0 0 0  Trouble relaxing 0 0 0 0  Restless 0 0 0 0  Easily annoyed or irritable 1 0 0 0  Afraid - awful might happen 0 0 0 0  Total GAD 7 Score 1 0 0 0  Anxiety Difficulty Not difficult at all Not difficult at all Not difficult at all Not difficult at all       12/15/2022    8:04 AM 08/29/2022    3:27 PM 11/18/2021    9:08 AM  Depression screen PHQ 2/9  Decreased Interest 1 0 0  Down, Depressed, Hopeless 1 0 0  PHQ - 2 Score 2 0 0  Altered sleeping 1 0 0  Tired, decreased energy 2 0 0  Change in appetite 1 0 0  Feeling bad or failure about yourself  1 0 0  Trouble concentrating 0 0 0  Moving slowly or fidgety/restless 0 0 0  Suicidal thoughts 0 0 0  PHQ-9 Score 7 0 0  Difficult doing work/chores Not difficult at all Not difficult at all Not difficult at all    BP Readings from Last 3 Encounters:  12/15/22 (!) 142/82  08/29/22 136/86  05/20/22 122/78    Physical Exam Vitals and nursing note reviewed.  HENT:     Head: Normocephalic.     Right Ear: External ear normal.     Left Ear: External ear  normal.     Nose: Nose normal.  Eyes:     General: No scleral icterus.       Right eye: No discharge.        Left eye: No discharge.     Conjunctiva/sclera: Conjunctivae normal.     Pupils: Pupils are equal, round, and reactive to light.  Neck:     Thyroid: No thyromegaly.     Vascular: No JVD.     Trachea: No tracheal deviation.  Cardiovascular:     Rate and Rhythm: Normal  rate and regular rhythm.     Heart sounds: Normal heart sounds. No murmur heard.    No friction rub. No gallop.  Pulmonary:     Effort: No respiratory distress.     Breath sounds: Normal breath sounds. No wheezing or rales.  Abdominal:     General: Bowel sounds are normal.     Palpations: Abdomen is soft. There is no mass.     Tenderness: There is no abdominal tenderness. There is no guarding or rebound.  Musculoskeletal:        General: No tenderness. Normal range of motion.     Cervical back: Normal range of motion and neck supple.  Lymphadenopathy:     Cervical: No cervical adenopathy.  Skin:    General: Skin is warm.     Findings: No rash.  Neurological:     Mental Status: He is alert and oriented to person, place, and time.     Cranial Nerves: No cranial nerve deficit.     Deep Tendon Reflexes: Reflexes are normal and symmetric.     Wt Readings from Last 3 Encounters:  12/15/22 241 lb (109.3 kg)  08/29/22 239 lb (108.4 kg)  06/30/22 240 lb (108.9 kg)    BP (!) 142/82   Pulse 92   Ht 6' (1.829 m)   Wt 241 lb (109.3 kg)   SpO2 94%   BMI 32.69 kg/m   Assessment and Plan:  1. Essential hypertension Chronic.  Controlled.  Stable.  Blood pressure 128/80.  Asymptomatic.  Tolerating medications well.  Continue metoprolol XL 100 mg once a day, hydrochlorothiazide 12.5 mg once a day, and amlodipine 10 mg once a day.  Will check renal function panel for electrolytes and GFR.  Will recheck patient in 6 months. - metoprolol succinate (TOPROL-XL) 100 MG 24 hr tablet; Take 1 tablet (100 mg total) by  mouth daily. Take with or immediately following a meal.  Dispense: 90 tablet; Refill: 1 - hydrochlorothiazide (HYDRODIURIL) 12.5 MG tablet; Take 1 tablet (12.5 mg total) by mouth daily.  Dispense: 90 tablet; Refill: 1 - amLODipine (NORVASC) 10 MG tablet; TAKE 1 TABLET (10 MG TOTAL) BY MOUTH DAILY AT 6 (SIX) AM.  Dispense: 90 tablet; Refill: 1 - Renal Function Panel  2. Familial hypercholesterolemia Chronic.  Controlled.  Stable.  Continue atorvastatin 10 mg daily.  Will check lipid panel for current level of control of LDL.  Patient has been reissued guidelines for low-cholesterol low triglyceride dietary discretion.  Will recheck patient in 6 months. - atorvastatin (LIPITOR) 10 MG tablet; Take 1 tablet (10 mg total) by mouth daily.  Dispense: 90 tablet; Refill: 1 - Lipid Panel With LDL/HDL Ratio  3. Gastroesophageal reflux disease, unspecified whether esophagitis present Chronic.  Controlled.  Stable.  Except for spicy foods patient is able to tolerate what ever he wishes to eat and will continue pantoprazole 40 mg once a day.  Will recheck in 6 months. - pantoprazole (PROTONIX) 40 MG tablet; Take 1 tablet (40 mg total) by mouth daily.  Dispense: 90 tablet; Refill: 1    Elizabeth Sauer, MD

## 2022-12-16 ENCOUNTER — Encounter: Payer: Self-pay | Admitting: Family Medicine

## 2022-12-16 LAB — RENAL FUNCTION PANEL
Albumin: 4.2 g/dL (ref 3.8–4.9)
BUN/Creatinine Ratio: 9 (ref 9–20)
BUN: 11 mg/dL (ref 6–24)
CO2: 23 mmol/L (ref 20–29)
Calcium: 9.1 mg/dL (ref 8.7–10.2)
Chloride: 98 mmol/L (ref 96–106)
Creatinine, Ser: 1.16 mg/dL (ref 0.76–1.27)
Glucose: 95 mg/dL (ref 70–99)
Phosphorus: 3.7 mg/dL (ref 2.8–4.1)
Potassium: 4 mmol/L (ref 3.5–5.2)
Sodium: 137 mmol/L (ref 134–144)
eGFR: 74 mL/min/{1.73_m2} (ref >59)

## 2022-12-16 LAB — LIPID PANEL WITH LDL/HDL RATIO
Cholesterol, Total: 175 mg/dL (ref 100–199)
HDL: 62 mg/dL (ref >39)
LDL Chol Calc (NIH): 77 mg/dL (ref 0–99)
LDL/HDL Ratio: 1.2 ratio (ref 0.0–3.6)
Triglycerides: 217 mg/dL — ABNORMAL HIGH (ref 0–149)
VLDL Cholesterol Cal: 36 mg/dL (ref 5–40)

## 2023-05-22 ENCOUNTER — Ambulatory Visit: Payer: Self-pay | Admitting: Family Medicine

## 2023-07-29 ENCOUNTER — Other Ambulatory Visit: Payer: Self-pay | Admitting: Acute Care

## 2023-07-29 DIAGNOSIS — Z122 Encounter for screening for malignant neoplasm of respiratory organs: Secondary | ICD-10-CM

## 2023-07-29 DIAGNOSIS — Z87891 Personal history of nicotine dependence: Secondary | ICD-10-CM

## 2023-07-29 DIAGNOSIS — F17210 Nicotine dependence, cigarettes, uncomplicated: Secondary | ICD-10-CM

## 2023-08-07 IMAGING — CT CT CHEST LUNG CANCER SCREENING LOW DOSE W/O CM
2 of 5 series · 15 of 40 positions shown, 18 images · non-contrast
Comparison: 05/16/2020.

CLINICAL DATA: Current smoker, 37 pack-year history.



[Series 3: lung 1.00 · axial · 0.69mm/px · z∈[-1246,-946]mm · 12 of 331 slices shown, 15 images]
[im 16/331  mediastinal]
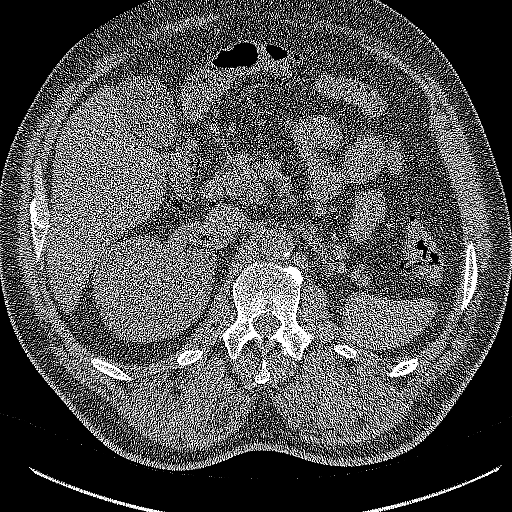
[im 16/331  lung]
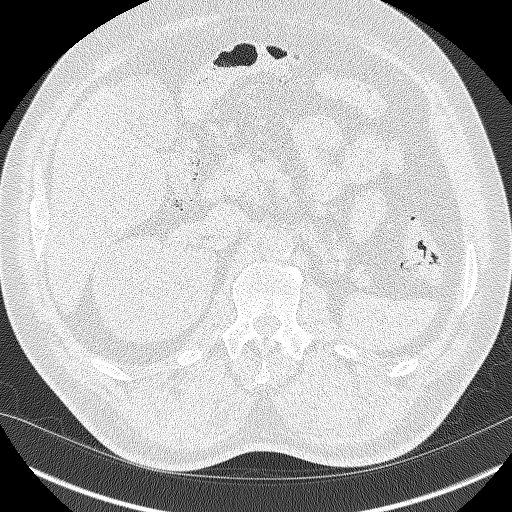
[im 46/331  lung]
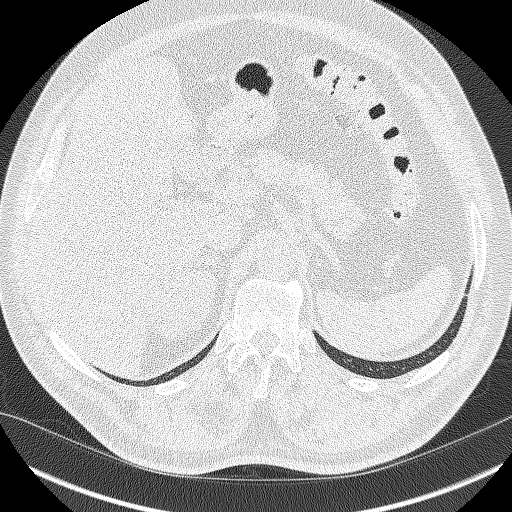
[im 76/331  lung]
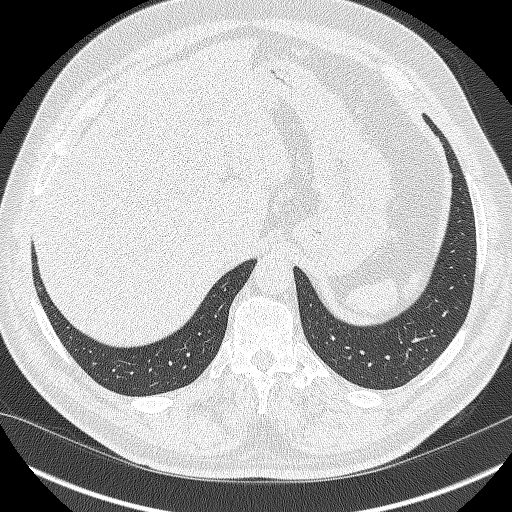
[im 106/331  lung]
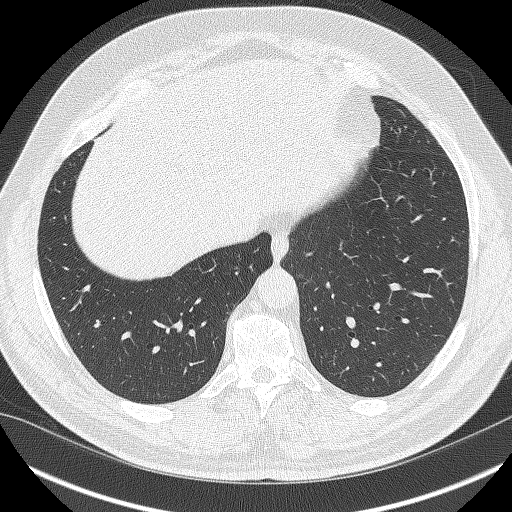
[im 121/331  mediastinal]
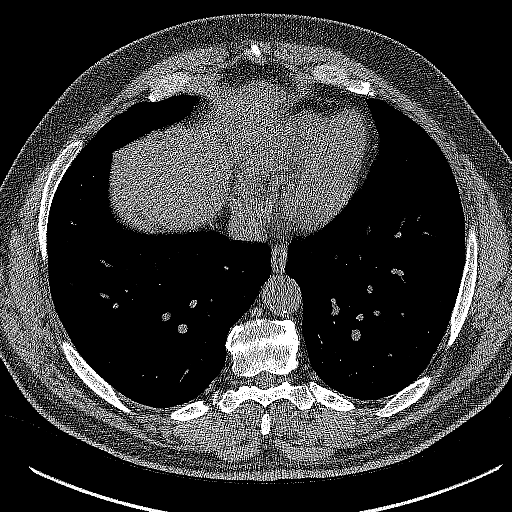
[im 121/331  lung]
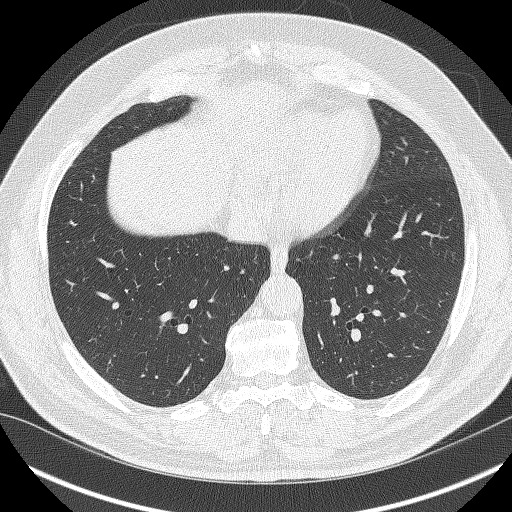
[im 151/331  lung]
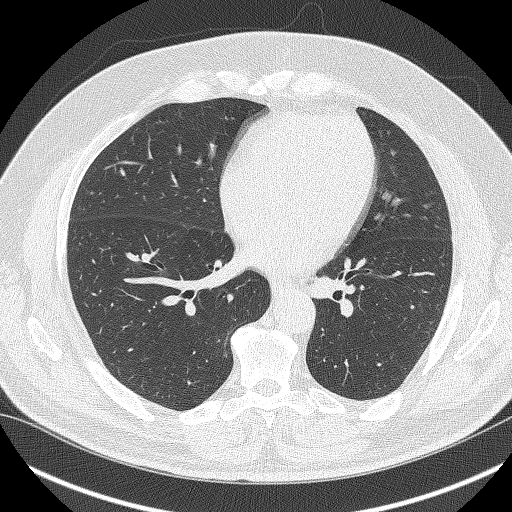
[im 181/331  lung]
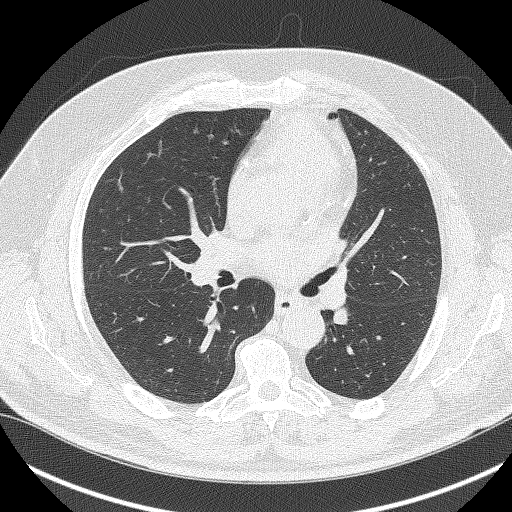
[im 211/331  lung]
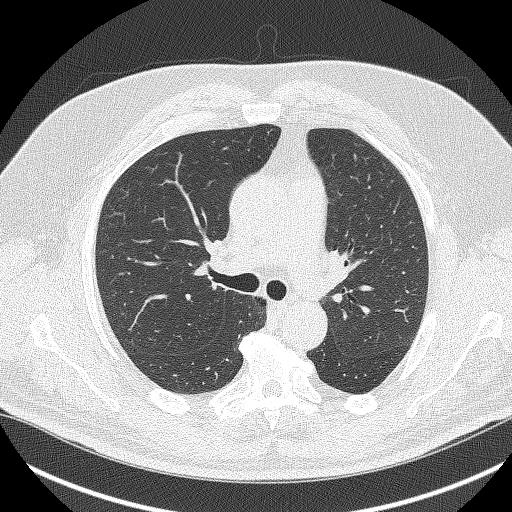
[im 226/331  mediastinal]
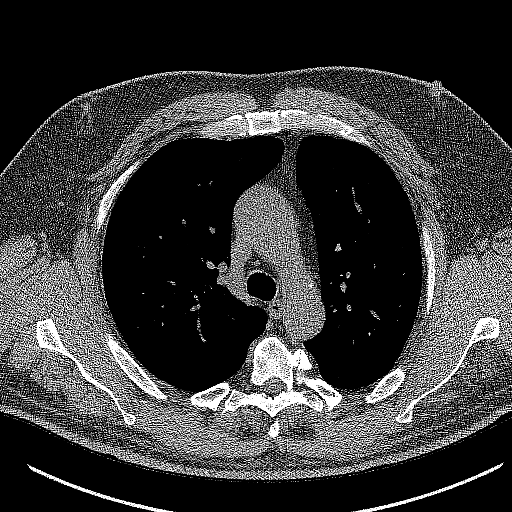
[im 226/331  lung]
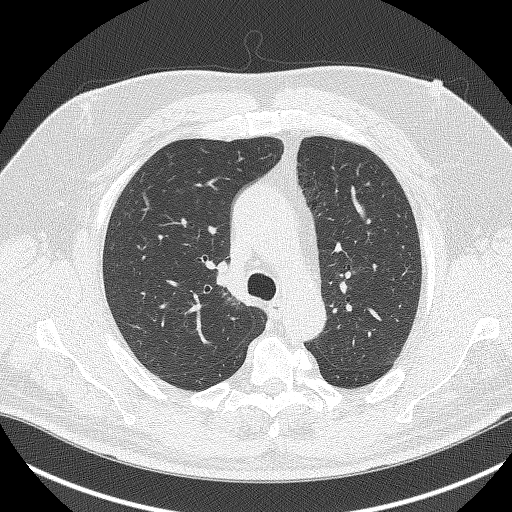
[im 256/331  lung]
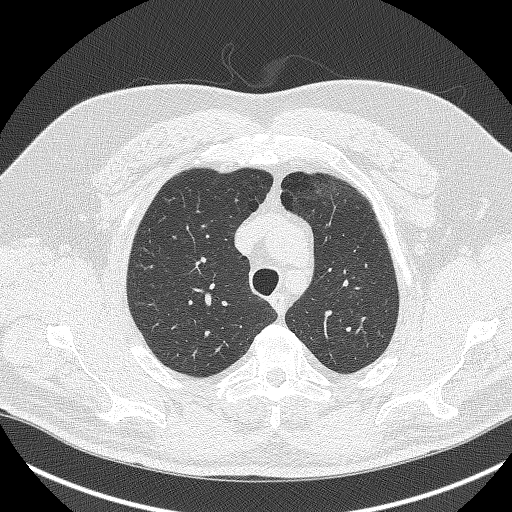
[im 286/331  lung]
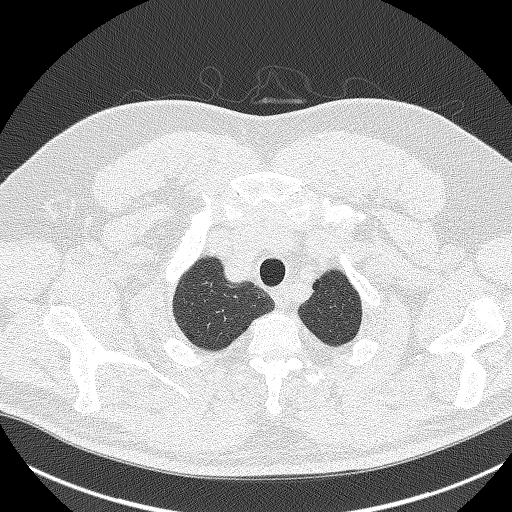
[im 316/331  lung]
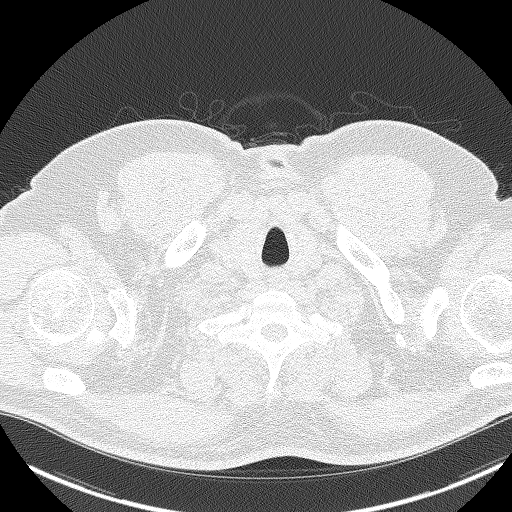

[Series 5: coronals lung 1.00 cor · coronal · 0.65mm/px · 3 of 330 slices shown]
[im 66/330  lung]
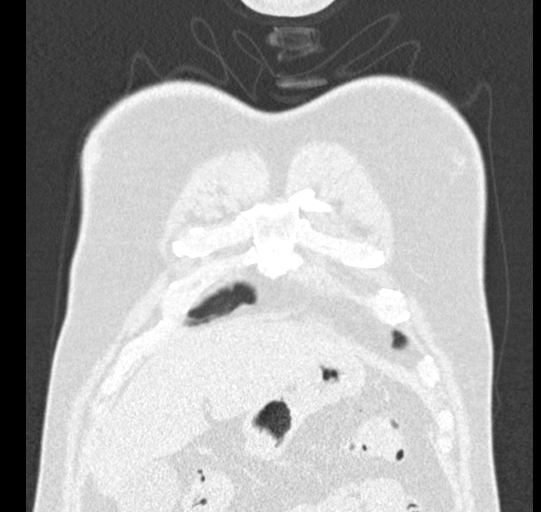
[im 132/330  lung]
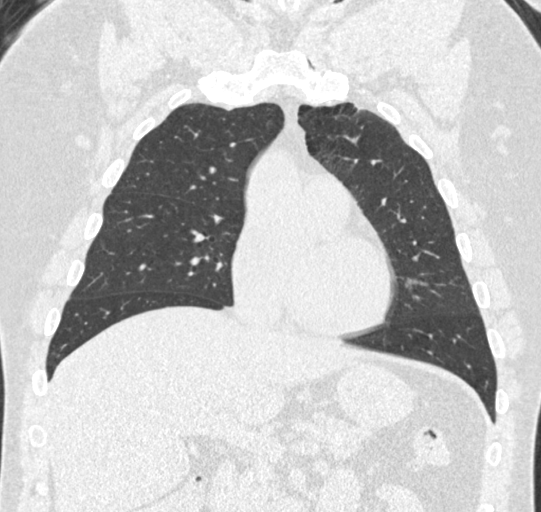
[im 198/330  lung]
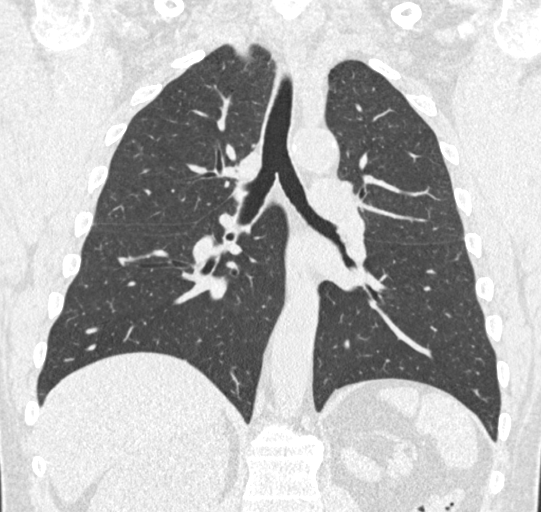

[15 of 40 positions shown; findings below may reference images not displayed]

FINDINGS: Cardiovascular: Enlarged pulmonic trunk. Atherosclerotic
calcification of the aorta and coronary arteries. Heart size normal.
No pericardial effusion.

Mediastinum/Nodes: No pathologically enlarged mediastinal or
axillary lymph nodes. Hilar regions are difficult to definitively
evaluate without IV contrast. Esophagus is grossly unremarkable.

Lungs/Pleura: Centrilobular and paraseptal emphysema. Mild smoking
related respiratory bronchiolitis. 2.7 mm left lower lobe nodule,
stable. No new or suspicious pulmonary nodules. No pleural fluid.
Airway is unremarkable.

Upper Abdomen: Visualized portions of the liver, gallbladder and
adrenal glands are unremarkable. 2.8 cm low-density lesion in right
kidney is incompletely imaged. No cystic follow-up necessary.
Visualized portions of the spleen, pancreas, stomach and bowel are
grossly unremarkable.

Musculoskeletal: Degenerative changes in the spine. No worrisome
lytic or sclerotic lesions.
IMPRESSION: 1. Lung-RADS 2, benign appearance or behavior. Continue annual
screening with low-dose chest CT without contrast in 12 months.
2. Aortic atherosclerosis (5VK4I-CMI.I). Coronary artery
calcification.
3. Enlarged pulmonic trunk, indicative of pulmonary arterial
hypertension.
4.  Emphysema ([id number].3).
